# Patient Record
Sex: Male | Born: 1955 | Race: White | Hispanic: No
Health system: Southern US, Community
[De-identification: ages and names within clinical notes are randomized; demographics above are authoritative.]

## PROBLEM LIST (undated history)

## (undated) DIAGNOSIS — E039 Hypothyroidism, unspecified: Secondary | ICD-10-CM

## (undated) DIAGNOSIS — G8929 Other chronic pain: Secondary | ICD-10-CM

## (undated) DIAGNOSIS — G4730 Sleep apnea, unspecified: Secondary | ICD-10-CM

## (undated) DIAGNOSIS — Z8631 Personal history of diabetic foot ulcer: Secondary | ICD-10-CM

## (undated) DIAGNOSIS — E119 Type 2 diabetes mellitus without complications: Secondary | ICD-10-CM

## (undated) DIAGNOSIS — I4891 Unspecified atrial fibrillation: Secondary | ICD-10-CM

## (undated) DIAGNOSIS — L570 Actinic keratosis: Secondary | ICD-10-CM

## (undated) DIAGNOSIS — I10 Essential (primary) hypertension: Secondary | ICD-10-CM

## (undated) DIAGNOSIS — D696 Thrombocytopenia, unspecified: Secondary | ICD-10-CM

## (undated) DIAGNOSIS — N183 Chronic kidney disease, stage 3 unspecified: Secondary | ICD-10-CM

## (undated) DIAGNOSIS — E669 Obesity, unspecified: Secondary | ICD-10-CM

## (undated) DIAGNOSIS — E1142 Type 2 diabetes mellitus with diabetic polyneuropathy: Secondary | ICD-10-CM

## (undated) DIAGNOSIS — G629 Polyneuropathy, unspecified: Secondary | ICD-10-CM

## (undated) DIAGNOSIS — R269 Unspecified abnormalities of gait and mobility: Secondary | ICD-10-CM

## (undated) DIAGNOSIS — E785 Hyperlipidemia, unspecified: Secondary | ICD-10-CM

## (undated) DIAGNOSIS — M109 Gout, unspecified: Secondary | ICD-10-CM

## (undated) DIAGNOSIS — M545 Low back pain, unspecified: Secondary | ICD-10-CM

## (undated) DIAGNOSIS — R609 Edema, unspecified: Secondary | ICD-10-CM

## (undated) HISTORY — DX: Chronic kidney disease, stage 3 unspecified: N18.30

## (undated) HISTORY — DX: Essential (primary) hypertension: I10

## (undated) HISTORY — DX: Gout, unspecified: M10.9

## (undated) HISTORY — DX: Low back pain, unspecified: M54.50

## (undated) HISTORY — DX: Type 2 diabetes mellitus with diabetic polyneuropathy: E11.42

## (undated) HISTORY — DX: Hyperlipidemia, unspecified: E78.5

## (undated) HISTORY — PX: CHOLECYSTECTOMY: SHX55

## (undated) HISTORY — DX: Unspecified abnormalities of gait and mobility: R26.9

## (undated) HISTORY — DX: Hypothyroidism, unspecified: E03.9

## (undated) HISTORY — DX: Unspecified atrial fibrillation: I48.91

## (undated) HISTORY — DX: Actinic keratosis: L57.0

## (undated) HISTORY — DX: Obesity, unspecified: E66.9

## (undated) HISTORY — DX: Edema, unspecified: R60.9

## (undated) HISTORY — DX: Thrombocytopenia, unspecified: D69.6

## (undated) HISTORY — DX: Other chronic pain: G89.29

## (undated) HISTORY — DX: Sleep apnea, unspecified: G47.30

## (undated) HISTORY — DX: Polyneuropathy, unspecified: G62.9

## (undated) HISTORY — DX: Personal history of diabetic foot ulcer: Z86.31

## (undated) HISTORY — DX: Type 2 diabetes mellitus without complications: E11.9

---

## 1998-06-13 ENCOUNTER — Encounter: Payer: Self-pay | Admitting: Emergency Medicine

## 1998-06-13 ENCOUNTER — Emergency Department (HOSPITAL_COMMUNITY): Admission: EM | Admit: 1998-06-13 | Discharge: 1998-06-13 | Payer: Self-pay | Admitting: Emergency Medicine

## 1998-06-13 ENCOUNTER — Encounter: Admission: RE | Admit: 1998-06-13 | Discharge: 1998-06-13 | Payer: Self-pay | Admitting: *Deleted

## 1998-12-17 ENCOUNTER — Encounter: Payer: Self-pay | Admitting: Emergency Medicine

## 1998-12-17 ENCOUNTER — Emergency Department (HOSPITAL_COMMUNITY): Admission: EM | Admit: 1998-12-17 | Discharge: 1998-12-17 | Payer: Self-pay | Admitting: Emergency Medicine

## 1998-12-18 ENCOUNTER — Encounter: Admission: RE | Admit: 1998-12-18 | Discharge: 1998-12-18 | Payer: Self-pay | Admitting: *Deleted

## 2016-11-19 DIAGNOSIS — I10 Essential (primary) hypertension: Secondary | ICD-10-CM | POA: Diagnosis not present

## 2016-11-19 DIAGNOSIS — R109 Unspecified abdominal pain: Secondary | ICD-10-CM | POA: Diagnosis not present

## 2016-11-19 DIAGNOSIS — I480 Paroxysmal atrial fibrillation: Secondary | ICD-10-CM | POA: Diagnosis not present

## 2016-11-19 DIAGNOSIS — E119 Type 2 diabetes mellitus without complications: Secondary | ICD-10-CM | POA: Diagnosis not present

## 2016-11-19 DIAGNOSIS — G4733 Obstructive sleep apnea (adult) (pediatric): Secondary | ICD-10-CM | POA: Diagnosis not present

## 2016-11-19 DIAGNOSIS — K8020 Calculus of gallbladder without cholecystitis without obstruction: Secondary | ICD-10-CM

## 2016-11-20 DIAGNOSIS — E119 Type 2 diabetes mellitus without complications: Secondary | ICD-10-CM | POA: Diagnosis not present

## 2016-11-20 DIAGNOSIS — G4733 Obstructive sleep apnea (adult) (pediatric): Secondary | ICD-10-CM | POA: Diagnosis not present

## 2016-11-20 DIAGNOSIS — K8020 Calculus of gallbladder without cholecystitis without obstruction: Secondary | ICD-10-CM | POA: Diagnosis not present

## 2016-11-20 DIAGNOSIS — I480 Paroxysmal atrial fibrillation: Secondary | ICD-10-CM | POA: Diagnosis not present

## 2016-11-20 DIAGNOSIS — R109 Unspecified abdominal pain: Secondary | ICD-10-CM | POA: Diagnosis not present

## 2016-11-20 DIAGNOSIS — I10 Essential (primary) hypertension: Secondary | ICD-10-CM | POA: Diagnosis not present

## 2019-01-07 DIAGNOSIS — D6959 Other secondary thrombocytopenia: Secondary | ICD-10-CM | POA: Diagnosis not present

## 2019-01-07 DIAGNOSIS — D696 Thrombocytopenia, unspecified: Secondary | ICD-10-CM | POA: Diagnosis not present

## 2019-01-07 DIAGNOSIS — R161 Splenomegaly, not elsewhere classified: Secondary | ICD-10-CM | POA: Diagnosis not present

## 2019-01-11 ENCOUNTER — Encounter: Payer: Self-pay | Admitting: Neurology

## 2019-01-11 ENCOUNTER — Ambulatory Visit (INDEPENDENT_AMBULATORY_CARE_PROVIDER_SITE_OTHER): Payer: Medicare Other | Admitting: Neurology

## 2019-01-11 ENCOUNTER — Other Ambulatory Visit: Payer: Self-pay

## 2019-01-11 VITALS — BP 133/76 | HR 71 | Temp 98.4°F | Ht 69.0 in | Wt 293.0 lb

## 2019-01-11 DIAGNOSIS — E538 Deficiency of other specified B group vitamins: Secondary | ICD-10-CM

## 2019-01-11 DIAGNOSIS — E1142 Type 2 diabetes mellitus with diabetic polyneuropathy: Secondary | ICD-10-CM | POA: Diagnosis not present

## 2019-01-11 MED ORDER — DULOXETINE HCL 60 MG PO CPEP: 60.0000 mg | ORAL_CAPSULE | Freq: Two times a day (BID) | ORAL | 1 refills | Status: DC

## 2019-01-11 NOTE — Progress Notes (Addendum)
Reason for visit: Diabetic peripheral neuropathy  Referring physician: Dr. Aundria Mems is a 63 y.o. male  History of present illness:  Chris Wood is a 63 year old right-handed white male with a history of diabetes and obesity.  He claims that his last hemoglobin A1c was 5.6.  The patient believes that he has had diabetes for 5 or 6 years and over the last 3 to 4 years he has had some discomfort in the feet with numbness and achy pain.  The pain is worse in the evening hours when he tries to sleep.  He also has untreated sleep apnea, he is not using his CPAP machine, he wakes up 5-6 times a night and he does have some fatigue during the day.  He also suffers from low back pain.  He denies issues controlling the bowels or the bladder.  He has numbness that he perceives across the ankles bilaterally, he denies any numbness in the hands.  Over the last 6 months he has developed a resting tremor affecting the right upper extremity, he has not noticed any change in handwriting.  He denies any family history of tremor.  He has been treated with high-dose gabapentin taking 1200 mg 3 times daily and with Cymbalta taking 90 mg at night without complete benefit with his leg discomfort.  The patient has not noted any change in his mobility, he does have some balance issues, he fell about a year ago.  He does not use a cane when outside of the house.  He is sent to this office for evaluation of the neuropathy.  Past Medical History:  Diagnosis Date  . Diabetes ([place])   . Neuropathy     Past Surgical History:  Procedure Laterality Date  . CHOLECYSTECTOMY      Family History  Problem Relation Age of Onset  . Healthy Mother   . Cancer Father        esophageal     Social history:  reports that he quit smoking about 3 years ago. His smoking use included cigarettes. He has never used smokeless tobacco. He reports current alcohol use. He reports that he does not use drugs.  Medications:   Prior to Admission medications   Medication Sig Start Date End Date Taking? Authorizing Provider  allopurinol (ZYLOPRIM) 300 MG tablet Take 300 mg by mouth daily.   Yes [provider]  amiodarone (PACERONE) 200 MG tablet Take 200 mg by mouth daily. 1/2 tab daily   Yes [provider]  aspirin 81 MG tablet Take 81 mg by mouth daily.   Yes [provider]  Cholecalciferol (VITAMIN D3 PO) Take by mouth.   Yes [provider]  DULoxetine (CYMBALTA) 30 MG capsule Take 30 mg by mouth daily.   Yes [provider]  DULoxetine (CYMBALTA) 60 MG capsule Take 60 mg by mouth daily.   Yes [provider]  furosemide (LASIX) 40 MG tablet Take 40 mg by mouth.   Yes [provider]  gabapentin (NEURONTIN) 600 MG tablet Take 600 mg by mouth. 2 tablets three times a day   Yes [provider]  metFORMIN (GLUCOPHAGE-XR) 500 MG 24 hr tablet Take 500 mg by mouth 2 (two) times daily.   Yes [provider]  Multiple Vitamin (MULTIVITAMIN) capsule Take 1 capsule by mouth daily.   Yes [provider]  potassium chloride (K-DUR) 10 MEQ tablet Take 10 mEq by mouth daily.   Yes [provider]  rivaroxaban (XARELTO) 20 MG TABS tablet Take 20 mg by mouth daily with supper.   Yes [provider]  rosuvastatin (CRESTOR) 20 MG tablet Take 20 mg by mouth daily.   Yes [provider]  traMADol (ULTRAM) 50 MG tablet Take 50 mg by mouth every 6 (six) hours as needed.   Yes [provider]  valACYclovir (VALTREX) 500 MG tablet Take 500 mg by mouth daily.   Yes [provider]     Not on File  ROS:  Out of a complete 14 system review of symptoms, the patient complains only of the following symptoms, and all other reviewed systems are negative.  Right hand tremor Leg pain Chronic low back pain  Blood pressure 133/76, pulse 71, temperature 98.4 F (36.9 C), temperature source Oral, height 5'  9" (1.753 m), weight 293 lb (132.9 kg).  Physical Exam  General: The patient is alert and cooperative at the time of the examination.  The patient is markedly obese.  Eyes: Pupils are equal, round, and reactive to light. Discs are flat bilaterally.  Neck: The neck is supple, no carotid bruits are noted.  Respiratory: The respiratory examination is clear.  Cardiovascular: The cardiovascular examination reveals a regular rate and rhythm, no obvious murmurs or rubs are noted.  Skin: Extremities are with 3+ edema below the knees bilaterally.  Neurologic Exam  Mental status: The patient is alert and oriented x 3 at the time of the examination. The patient has apparent normal recent and remote memory, with an apparently normal attention span and concentration ability.  Cranial nerves: Facial symmetry is present. There is good sensation of the face to pinprick and soft touch bilaterally. The strength of the facial muscles and the muscles to head turning and shoulder shrug are normal bilaterally. Speech is well enunciated, no aphasia or dysarthria is noted. Extraocular movements are full, but with superior gaze there is exotropia of the left eye. Visual fields are full. The tongue is midline, and the patient has symmetric elevation of the soft palate. No obvious hearing deficits are noted.  Motor: The motor testing reveals 5 over 5 strength of all 4 extremities. Good symmetric motor tone is noted throughout.  Sensory: Sensory testing is intact to pinprick, soft touch, vibration sensation, and position sense on the upper extremities.  With the lower extremities, there is a stocking pattern pinprick sensory deficit in the distal two thirds of the legs bilaterally below the knees.  Vibration sensation and position sensation are impaired in both feet.  No evidence of extinction is noted.  Coordination: Cerebellar testing reveals good finger-nose-finger and heel-to-shin bilaterally.  A resting tremor  seen with the right upper extremity.  Gait and station: Gait is slightly wide-based, the patient can walk independently.  He does have a arm swing bilaterally.  Tandem gait is unsteady.  Romberg is negative. No drift is seen.  Reflexes: Deep tendon reflexes are symmetric, but are depressed bilaterally. Toes are downgoing bilaterally.   Assessment/Plan:  1.  Diabetes  2.  Probable diabetic peripheral neuropathy  3.  Resting tremor, right upper extremity  The patient likely has a diabetic neuropathy.  He will be sent for blood work today.  At some point in the future, we may consider EMG nerve conduction study evaluation as well.  The patient will be increased on the Cymbalta taking 60 mg twice daily, he will call me in 2 weeks if he is not getting significant benefit, we may add low-dose Trileptal or  Keppra to the regimen.  The patient is on Xarelto which may interact with some anticonvulsant medications.  He will follow-up in about 4 months.  We will need to follow him along for any developing signs of parkinsonism.  Jill Alexanders MD 01/11/2019 3:35 PM  [facility] [address] [place], [place] [address]  Phone [phone] Fax [phone]

## 2019-01-11 NOTE — Patient Instructions (Signed)
We will go up on the cymbalta to 60 mg twice a day, if no better in 2 weeks, call for medication adjustments.  Cymbalta (duloxetine) is an antidepressant medication that is commonly used for peripheral neuropathy pain or for fibromyalgia pain. As with any antidepressant medication, worsening depression can be seen. This medication can potentially cause headache, dizziness, sexual dysfunction, or nausea. If any problems are noted on this medication, please contact our office.

## 2019-01-13 ENCOUNTER — Telehealth: Payer: Self-pay

## 2019-01-13 LAB — MULTIPLE MYELOMA PANEL, SERUM
Albumin SerPl Elph-Mcnc: 4.4 g/dL (ref 2.9–4.4)
Albumin/Glob SerPl: 1.7 (ref 0.7–1.7)
Alpha 1: 0.3 g/dL (ref 0.0–0.4)
Alpha2 Glob SerPl Elph-Mcnc: 0.6 g/dL (ref 0.4–1.0)
B-Globulin SerPl Elph-Mcnc: 1 g/dL (ref 0.7–1.3)
Gamma Glob SerPl Elph-Mcnc: 0.8 g/dL (ref 0.4–1.8)
Globulin, Total: 2.7 g/dL (ref 2.2–3.9)
IgA/Immunoglobulin A, Serum: 243 mg/dL (ref 61–437)
IgG (Immunoglobin G), Serum: 764 mg/dL (ref 603–1613)
IgM (Immunoglobulin M), Srm: 109 mg/dL (ref 20–172)
Total Protein: 7.1 g/dL (ref 6.0–8.5)

## 2019-01-13 LAB — ANA W/REFLEX: Anti Nuclear Antibody (ANA): NEGATIVE

## 2019-01-13 LAB — SEDIMENTATION RATE: Sed Rate: 5 mm/hr (ref 0–30)

## 2019-01-13 LAB — ANGIOTENSIN CONVERTING ENZYME: Angio Convert Enzyme: 33 U/L (ref 14–82)

## 2019-01-13 LAB — VITAMIN B12: Vitamin B-12: 498 pg/mL (ref 232–1245)

## 2019-01-13 LAB — B. BURGDORFI ANTIBODIES: Lyme IgG/IgM Ab: <0.91 {ISR} (ref 0.00–0.90)

## 2019-01-13 NOTE — Telephone Encounter (Signed)
-----   Message from Kathrynn Ducking, MD sent at 01/13/2019  7:12 AM EDT -----  The blood work results are unremarkable. Please call the patient. ----- Message ----- From: Lavone Neri Lab Results In Sent: 01/12/2019   7:37 AM EDT To: Kathrynn Ducking, MD

## 2019-01-13 NOTE — Telephone Encounter (Signed)
I contacted the pt and was able to review blood test results. Pt verbalized understanding.

## 2019-01-27 DIAGNOSIS — E1142 Type 2 diabetes mellitus with diabetic polyneuropathy: Secondary | ICD-10-CM | POA: Diagnosis not present

## 2019-01-27 DIAGNOSIS — Z8631 Personal history of diabetic foot ulcer: Secondary | ICD-10-CM | POA: Diagnosis not present

## 2019-01-27 DIAGNOSIS — L84 Corns and callosities: Secondary | ICD-10-CM | POA: Diagnosis not present

## 2019-02-10 ENCOUNTER — Telehealth: Payer: Self-pay | Admitting: Neurology

## 2019-02-10 MED ORDER — LEVETIRACETAM 250 MG PO TABS: 250.0000 mg | ORAL_TABLET | Freq: Two times a day (BID) | ORAL | 2 refills | Status: DC

## 2019-02-10 NOTE — Telephone Encounter (Signed)
I called the patient.  The increased dose for Cymbalta has not helped his neuropathy pain.  As we had discussed in the office visit recently, we will add another medication called Keppra, we will start at 250 mg twice daily.  He will call me if this is not helpful.

## 2019-02-10 NOTE — Telephone Encounter (Signed)
Pt called stating that the DULoxetine (CYMBALTA) 60 MG capsule is not enough for him. Please advise.

## 2019-03-02 ENCOUNTER — Other Ambulatory Visit: Payer: Self-pay

## 2019-03-02 MED ORDER — LEVETIRACETAM 250 MG PO TABS: 250.0000 mg | ORAL_TABLET | Freq: Two times a day (BID) | ORAL | 2 refills | Status: DC

## 2019-03-04 DIAGNOSIS — N029 Recurrent and persistent hematuria with unspecified morphologic changes: Secondary | ICD-10-CM | POA: Diagnosis not present

## 2019-03-04 DIAGNOSIS — R82998 Other abnormal findings in urine: Secondary | ICD-10-CM | POA: Diagnosis not present

## 2019-03-11 DIAGNOSIS — N029 Recurrent and persistent hematuria with unspecified morphologic changes: Secondary | ICD-10-CM | POA: Diagnosis not present

## 2019-03-29 DIAGNOSIS — I480 Paroxysmal atrial fibrillation: Secondary | ICD-10-CM | POA: Diagnosis not present

## 2019-03-29 DIAGNOSIS — E039 Hypothyroidism, unspecified: Secondary | ICD-10-CM | POA: Diagnosis not present

## 2019-03-29 DIAGNOSIS — E02 Subclinical iodine-deficiency hypothyroidism: Secondary | ICD-10-CM | POA: Diagnosis not present

## 2019-03-29 DIAGNOSIS — I10 Essential (primary) hypertension: Secondary | ICD-10-CM | POA: Diagnosis not present

## 2019-03-29 DIAGNOSIS — E782 Mixed hyperlipidemia: Secondary | ICD-10-CM | POA: Diagnosis not present

## 2019-04-02 DIAGNOSIS — E02 Subclinical iodine-deficiency hypothyroidism: Secondary | ICD-10-CM | POA: Diagnosis not present

## 2019-04-14 DIAGNOSIS — L97529 Non-pressure chronic ulcer of other part of left foot with unspecified severity: Secondary | ICD-10-CM | POA: Diagnosis not present

## 2019-04-14 DIAGNOSIS — I872 Venous insufficiency (chronic) (peripheral): Secondary | ICD-10-CM | POA: Diagnosis not present

## 2019-04-14 DIAGNOSIS — E11621 Type 2 diabetes mellitus with foot ulcer: Secondary | ICD-10-CM | POA: Diagnosis not present

## 2019-04-14 DIAGNOSIS — E1142 Type 2 diabetes mellitus with diabetic polyneuropathy: Secondary | ICD-10-CM | POA: Diagnosis not present

## 2019-04-14 DIAGNOSIS — I890 Lymphedema, not elsewhere classified: Secondary | ICD-10-CM | POA: Diagnosis not present

## 2019-05-17 DIAGNOSIS — L84 Corns and callosities: Secondary | ICD-10-CM | POA: Diagnosis not present

## 2019-05-17 DIAGNOSIS — Z8631 Personal history of diabetic foot ulcer: Secondary | ICD-10-CM | POA: Diagnosis not present

## 2019-05-17 DIAGNOSIS — E1142 Type 2 diabetes mellitus with diabetic polyneuropathy: Secondary | ICD-10-CM | POA: Diagnosis not present

## 2019-05-25 ENCOUNTER — Encounter: Payer: Self-pay | Admitting: Neurology

## 2019-05-25 ENCOUNTER — Other Ambulatory Visit: Payer: Self-pay

## 2019-05-25 ENCOUNTER — Ambulatory Visit (INDEPENDENT_AMBULATORY_CARE_PROVIDER_SITE_OTHER): Payer: Medicare Other | Admitting: Neurology

## 2019-05-25 DIAGNOSIS — E1140 Type 2 diabetes mellitus with diabetic neuropathy, unspecified: Secondary | ICD-10-CM | POA: Insufficient documentation

## 2019-05-25 DIAGNOSIS — E1142 Type 2 diabetes mellitus with diabetic polyneuropathy: Secondary | ICD-10-CM | POA: Diagnosis not present

## 2019-05-25 NOTE — Progress Notes (Signed)
Reason for visit: Diabetic peripheral neuropathy  Chris Wood is an 63 y.o. male  History of present illness:  Chris Wood is a 63 year old right-handed white male with a history of obesity, sleep apnea, diabetes, and diabetic peripheral neuropathy.  The patient is not on CPAP, he will wake up frequently at night.  He sometimes will have trouble getting to sleep, he may take Advil PM but this causes bad dreams.  The patient had a tremor on the right upper extremity when last seen, this has not been a problem since that time.  He is on full dose Cymbalta, Keppra was added to the regimen with good improvement.  He is not having a lot of discomfort from the neuropathy at this point.  He denies any significant balance issues or falls.  Past Medical History:  Diagnosis Date  . Actinic keratosis   . Atrial fibrillation ([place])   . Chronic low back pain   . Chronic renal insufficiency, stage 3 (moderate) (HCC)   . Diabetes ([place])   . Diabetes ([place])   . Diabetic peripheral neuropathy ([place])   . Gait disorder   . Gout   . Hyperlipidemia   . Hypertension   . Hypothyroidism   . Neuropathy   . Obesity   . Peripheral edema   . Personal history of diabetic foot ulcer   . Sleep apnea   . Thrombocytopenia ([place])     Past Surgical History:  Procedure Laterality Date  . CHOLECYSTECTOMY      Family History  Problem Relation Age of Onset  . Healthy Mother   . Cancer Father        esophageal   . Cancer Sister   . Stroke Sister     Social history:  reports that he quit smoking about 3 years ago. His smoking use included cigarettes. He has never used smokeless tobacco. He reports current alcohol use. He reports that he does not use drugs.   Not on File  Medications:  Prior to Admission medications   Medication Sig Start Date End Date Taking? Authorizing Provider  allopurinol (ZYLOPRIM) 300 MG tablet Take 300 mg by mouth daily.   Yes [provider]  amiodarone (PACERONE)  200 MG tablet Take 200 mg by mouth daily. 1/2 tab daily   Yes [provider]  aspirin 81 MG tablet Take 81 mg by mouth daily.   Yes [provider]  Cholecalciferol (VITAMIN D3 PO) Take by mouth.   Yes [provider]  DULoxetine (CYMBALTA) 60 MG capsule Take 1 capsule (60 mg total) by mouth 2 (two) times daily. 01/11/19  Yes Kathrynn Ducking, MD  furosemide (LASIX) 40 MG tablet Take 40 mg by mouth.   Yes [provider]  gabapentin (NEURONTIN) 600 MG tablet Take 600 mg by mouth. 2 tablets three times a day   Yes [provider]  levETIRAcetam (KEPPRA) 250 MG tablet Take 1 tablet (250 mg total) by mouth 2 (two) times daily. 03/02/19  Yes Kathrynn Ducking, MD  metFORMIN (GLUCOPHAGE-XR) 500 MG 24 hr tablet Take 500 mg by mouth 2 (two) times daily.   Yes [provider]  Multiple Vitamin (MULTIVITAMIN) capsule Take 1 capsule by mouth daily.   Yes [provider]  potassium chloride (K-DUR) 10 MEQ tablet Take 10 mEq by mouth daily.   Yes [provider]  rivaroxaban (XARELTO) 20 MG TABS tablet Take 20 mg by mouth daily with supper.   Yes [provider]  rosuvastatin (CRESTOR) 20 MG tablet Take 20 mg by mouth daily.   Yes [provider]  traMADol (ULTRAM) 50 MG tablet Take 50 mg by mouth every 6 (six) hours as needed.   Yes [provider]  valACYclovir (VALTREX) 500 MG tablet Take 500 mg by mouth daily.   Yes [provider]    ROS:  Out of a complete 14 system review of symptoms, the patient complains only of the following symptoms, and all other reviewed systems are negative.  Numbness Insomnia, frequent waking  Blood pressure 136/84, pulse 92, temperature (!) 96.2 F (35.7 C), temperature source Temporal, height 5\' 9"  (1.753 m), weight 292 lb 5 oz (132.6 kg).  Physical Exam  General: The patient is alert and cooperative at the time of the examination.  The patient is markedly  obese.  Skin: 3+ edema below the knees is seen bilaterally.   Neurologic Exam  Mental status: The patient is alert and oriented x 3 at the time of the examination. The patient has apparent normal recent and remote memory, with an apparently normal attention span and concentration ability.   Cranial nerves: Facial symmetry is present. Speech is normal, no aphasia or dysarthria is noted. Extraocular movements are full. Visual fields are full.  Motor: The patient has good strength in all 4 extremities.  Sensory examination: Soft touch sensation is symmetric on the face, arms, and legs.  Coordination: The patient has good finger-nose-finger and heel-to-shin bilaterally.  Gait and station: The patient has a normal gait good arm swing is seen bilaterally. Tandem gait is minimally unsteady. Romberg is negative. No drift is seen.  Reflexes: Deep tendon reflexes are symmetric.   Assessment/Plan:  1.  Diabetic peripheral neuropathy  2.  Sleep apnea, not on CPAP  I have indicated to the patient if he wishes to make contact with a sleep physician, I will make a referral, he has no one following him for his sleep apnea currently.  The patient is doing better with his peripheral neuropathy pain.  He will continue on the Cymbalta, gabapentin, and Keppra.  He will follow-up here in 6 months.  The tremor seen on the right upper extremity on last visit is no longer present.  Jill Alexanders MD 05/25/2019 2:52 PM  [facility] [address] [place], [place] [address]  Phone [phone] Fax [phone]

## 2019-06-23 ENCOUNTER — Other Ambulatory Visit: Payer: Self-pay | Admitting: Neurology

## 2019-07-01 DIAGNOSIS — R319 Hematuria, unspecified: Secondary | ICD-10-CM | POA: Diagnosis not present

## 2019-07-10 DIAGNOSIS — E039 Hypothyroidism, unspecified: Secondary | ICD-10-CM | POA: Diagnosis not present

## 2019-07-10 DIAGNOSIS — E782 Mixed hyperlipidemia: Secondary | ICD-10-CM | POA: Diagnosis not present

## 2019-07-10 DIAGNOSIS — E1142 Type 2 diabetes mellitus with diabetic polyneuropathy: Secondary | ICD-10-CM | POA: Diagnosis not present

## 2019-07-10 DIAGNOSIS — N1831 Chronic kidney disease, stage 3a: Secondary | ICD-10-CM | POA: Diagnosis not present

## 2019-07-10 DIAGNOSIS — M109 Gout, unspecified: Secondary | ICD-10-CM | POA: Diagnosis not present

## 2019-07-13 DIAGNOSIS — L84 Corns and callosities: Secondary | ICD-10-CM | POA: Diagnosis not present

## 2019-07-13 DIAGNOSIS — Z6841 Body Mass Index (BMI) 40.0 and over, adult: Secondary | ICD-10-CM | POA: Diagnosis not present

## 2019-07-13 DIAGNOSIS — E785 Hyperlipidemia, unspecified: Secondary | ICD-10-CM | POA: Diagnosis not present

## 2019-07-13 DIAGNOSIS — E1142 Type 2 diabetes mellitus with diabetic polyneuropathy: Secondary | ICD-10-CM | POA: Diagnosis not present

## 2019-07-13 DIAGNOSIS — N529 Male erectile dysfunction, unspecified: Secondary | ICD-10-CM | POA: Diagnosis not present

## 2019-07-13 DIAGNOSIS — N1831 Chronic kidney disease, stage 3a: Secondary | ICD-10-CM | POA: Diagnosis not present

## 2019-07-13 DIAGNOSIS — G4733 Obstructive sleep apnea (adult) (pediatric): Secondary | ICD-10-CM | POA: Diagnosis not present

## 2019-07-13 DIAGNOSIS — M1009 Idiopathic gout, multiple sites: Secondary | ICD-10-CM | POA: Diagnosis not present

## 2019-07-13 DIAGNOSIS — Z23 Encounter for immunization: Secondary | ICD-10-CM | POA: Diagnosis not present

## 2019-07-13 DIAGNOSIS — Z0000 Encounter for general adult medical examination without abnormal findings: Secondary | ICD-10-CM | POA: Diagnosis not present

## 2019-07-13 DIAGNOSIS — E039 Hypothyroidism, unspecified: Secondary | ICD-10-CM | POA: Diagnosis not present

## 2019-07-13 DIAGNOSIS — I872 Venous insufficiency (chronic) (peripheral): Secondary | ICD-10-CM | POA: Diagnosis not present

## 2019-07-13 DIAGNOSIS — E1122 Type 2 diabetes mellitus with diabetic chronic kidney disease: Secondary | ICD-10-CM | POA: Diagnosis not present

## 2019-07-14 DIAGNOSIS — D696 Thrombocytopenia, unspecified: Secondary | ICD-10-CM | POA: Diagnosis not present

## 2019-07-14 DIAGNOSIS — R161 Splenomegaly, not elsewhere classified: Secondary | ICD-10-CM | POA: Diagnosis not present

## 2019-07-14 DIAGNOSIS — R319 Hematuria, unspecified: Secondary | ICD-10-CM | POA: Diagnosis not present

## 2019-07-14 DIAGNOSIS — E1142 Type 2 diabetes mellitus with diabetic polyneuropathy: Secondary | ICD-10-CM | POA: Diagnosis not present

## 2019-07-14 DIAGNOSIS — Z7982 Long term (current) use of aspirin: Secondary | ICD-10-CM | POA: Diagnosis not present

## 2019-07-14 DIAGNOSIS — D6959 Other secondary thrombocytopenia: Secondary | ICD-10-CM | POA: Diagnosis not present

## 2019-07-20 DIAGNOSIS — R310 Gross hematuria: Secondary | ICD-10-CM | POA: Diagnosis not present

## 2019-07-20 DIAGNOSIS — N200 Calculus of kidney: Secondary | ICD-10-CM | POA: Diagnosis not present

## 2019-07-20 DIAGNOSIS — R319 Hematuria, unspecified: Secondary | ICD-10-CM | POA: Diagnosis not present

## 2019-07-27 DIAGNOSIS — R310 Gross hematuria: Secondary | ICD-10-CM | POA: Diagnosis not present

## 2019-07-28 DIAGNOSIS — R310 Gross hematuria: Secondary | ICD-10-CM | POA: Diagnosis not present

## 2019-08-12 DIAGNOSIS — G4733 Obstructive sleep apnea (adult) (pediatric): Secondary | ICD-10-CM | POA: Diagnosis not present

## 2019-09-06 DIAGNOSIS — Z20822 Contact with and (suspected) exposure to covid-19: Secondary | ICD-10-CM | POA: Diagnosis not present

## 2019-09-06 DIAGNOSIS — G4733 Obstructive sleep apnea (adult) (pediatric): Secondary | ICD-10-CM | POA: Diagnosis not present

## 2019-09-06 DIAGNOSIS — Z01812 Encounter for preprocedural laboratory examination: Secondary | ICD-10-CM | POA: Diagnosis not present

## 2019-09-22 ENCOUNTER — Other Ambulatory Visit: Payer: Self-pay

## 2019-09-22 ENCOUNTER — Other Ambulatory Visit: Payer: Self-pay | Admitting: Neurology

## 2019-09-22 MED ORDER — GABAPENTIN 600 MG PO TABS: ORAL_TABLET | ORAL | 3 refills | Status: DC

## 2019-09-22 MED ORDER — GABAPENTIN 600 MG PO TABS: ORAL_TABLET | ORAL | 5 refills | Status: DC

## 2019-09-22 NOTE — Telephone Encounter (Signed)
Refill has been submitted the to the pharmacy for the pt.

## 2019-09-22 NOTE — Telephone Encounter (Signed)
1) Medication(s) Requested (by name):  gabapentin (NEURONTIN) 600 MG tablet   2) [facility]

## 2019-10-04 DIAGNOSIS — Z20822 Contact with and (suspected) exposure to covid-19: Secondary | ICD-10-CM | POA: Diagnosis not present

## 2019-10-04 DIAGNOSIS — G4733 Obstructive sleep apnea (adult) (pediatric): Secondary | ICD-10-CM | POA: Diagnosis not present

## 2019-10-04 DIAGNOSIS — Z01812 Encounter for preprocedural laboratory examination: Secondary | ICD-10-CM | POA: Diagnosis not present

## 2019-10-08 DIAGNOSIS — G4733 Obstructive sleep apnea (adult) (pediatric): Secondary | ICD-10-CM | POA: Diagnosis not present

## 2019-10-13 DIAGNOSIS — G4733 Obstructive sleep apnea (adult) (pediatric): Secondary | ICD-10-CM | POA: Diagnosis not present

## 2019-10-18 DIAGNOSIS — G4733 Obstructive sleep apnea (adult) (pediatric): Secondary | ICD-10-CM | POA: Diagnosis not present

## 2019-10-19 DIAGNOSIS — N1831 Chronic kidney disease, stage 3a: Secondary | ICD-10-CM | POA: Diagnosis not present

## 2019-10-19 DIAGNOSIS — N400 Enlarged prostate without lower urinary tract symptoms: Secondary | ICD-10-CM | POA: Diagnosis not present

## 2019-10-19 DIAGNOSIS — N200 Calculus of kidney: Secondary | ICD-10-CM | POA: Diagnosis not present

## 2019-10-19 DIAGNOSIS — R310 Gross hematuria: Secondary | ICD-10-CM | POA: Diagnosis not present

## 2019-11-01 DIAGNOSIS — Z8631 Personal history of diabetic foot ulcer: Secondary | ICD-10-CM | POA: Diagnosis not present

## 2019-11-01 DIAGNOSIS — E119 Type 2 diabetes mellitus without complications: Secondary | ICD-10-CM | POA: Diagnosis not present

## 2019-11-01 DIAGNOSIS — E1142 Type 2 diabetes mellitus with diabetic polyneuropathy: Secondary | ICD-10-CM | POA: Diagnosis not present

## 2019-11-01 DIAGNOSIS — L84 Corns and callosities: Secondary | ICD-10-CM | POA: Diagnosis not present

## 2019-11-18 DIAGNOSIS — G4733 Obstructive sleep apnea (adult) (pediatric): Secondary | ICD-10-CM | POA: Diagnosis not present

## 2019-11-18 DIAGNOSIS — E1142 Type 2 diabetes mellitus with diabetic polyneuropathy: Secondary | ICD-10-CM | POA: Diagnosis not present

## 2019-11-18 DIAGNOSIS — K353 Acute appendicitis with localized peritonitis, without perforation or gangrene: Secondary | ICD-10-CM | POA: Diagnosis not present

## 2019-11-18 DIAGNOSIS — N1831 Chronic kidney disease, stage 3a: Secondary | ICD-10-CM | POA: Diagnosis not present

## 2019-11-18 DIAGNOSIS — E1122 Type 2 diabetes mellitus with diabetic chronic kidney disease: Secondary | ICD-10-CM | POA: Diagnosis not present

## 2019-11-18 DIAGNOSIS — I129 Hypertensive chronic kidney disease with stage 1 through stage 4 chronic kidney disease, or unspecified chronic kidney disease: Secondary | ICD-10-CM | POA: Diagnosis not present

## 2019-11-18 DIAGNOSIS — Z6841 Body Mass Index (BMI) 40.0 and over, adult: Secondary | ICD-10-CM | POA: Diagnosis not present

## 2019-11-18 DIAGNOSIS — I480 Paroxysmal atrial fibrillation: Secondary | ICD-10-CM | POA: Diagnosis not present

## 2019-11-18 DIAGNOSIS — Z87891 Personal history of nicotine dependence: Secondary | ICD-10-CM | POA: Diagnosis not present

## 2019-11-18 DIAGNOSIS — R1031 Right lower quadrant pain: Secondary | ICD-10-CM | POA: Diagnosis not present

## 2019-11-18 DIAGNOSIS — N183 Chronic kidney disease, stage 3 unspecified: Secondary | ICD-10-CM | POA: Diagnosis not present

## 2019-11-18 DIAGNOSIS — Z20822 Contact with and (suspected) exposure to covid-19: Secondary | ICD-10-CM | POA: Diagnosis not present

## 2019-11-19 DIAGNOSIS — K353 Acute appendicitis with localized peritonitis, without perforation or gangrene: Secondary | ICD-10-CM | POA: Diagnosis not present

## 2019-11-19 DIAGNOSIS — K3580 Unspecified acute appendicitis: Secondary | ICD-10-CM | POA: Diagnosis not present

## 2019-11-19 DIAGNOSIS — K660 Peritoneal adhesions (postprocedural) (postinfection): Secondary | ICD-10-CM | POA: Diagnosis not present

## 2019-11-20 DIAGNOSIS — R569 Unspecified convulsions: Secondary | ICD-10-CM | POA: Diagnosis present

## 2019-11-20 DIAGNOSIS — Z87891 Personal history of nicotine dependence: Secondary | ICD-10-CM | POA: Diagnosis not present

## 2019-11-20 DIAGNOSIS — K3580 Unspecified acute appendicitis: Secondary | ICD-10-CM | POA: Diagnosis not present

## 2019-11-20 DIAGNOSIS — N1831 Chronic kidney disease, stage 3a: Secondary | ICD-10-CM | POA: Diagnosis present

## 2019-11-20 DIAGNOSIS — N400 Enlarged prostate without lower urinary tract symptoms: Secondary | ICD-10-CM | POA: Diagnosis not present

## 2019-11-20 DIAGNOSIS — E1122 Type 2 diabetes mellitus with diabetic chronic kidney disease: Secondary | ICD-10-CM | POA: Diagnosis present

## 2019-11-20 DIAGNOSIS — D696 Thrombocytopenia, unspecified: Secondary | ICD-10-CM | POA: Diagnosis present

## 2019-11-20 DIAGNOSIS — E1142 Type 2 diabetes mellitus with diabetic polyneuropathy: Secondary | ICD-10-CM | POA: Diagnosis present

## 2019-11-20 DIAGNOSIS — I129 Hypertensive chronic kidney disease with stage 1 through stage 4 chronic kidney disease, or unspecified chronic kidney disease: Secondary | ICD-10-CM | POA: Diagnosis present

## 2019-11-20 DIAGNOSIS — I480 Paroxysmal atrial fibrillation: Secondary | ICD-10-CM | POA: Diagnosis present

## 2019-11-20 DIAGNOSIS — Z7982 Long term (current) use of aspirin: Secondary | ICD-10-CM | POA: Diagnosis not present

## 2019-11-20 DIAGNOSIS — E1140 Type 2 diabetes mellitus with diabetic neuropathy, unspecified: Secondary | ICD-10-CM | POA: Diagnosis not present

## 2019-11-20 DIAGNOSIS — Z20822 Contact with and (suspected) exposure to covid-19: Secondary | ICD-10-CM | POA: Diagnosis present

## 2019-11-20 DIAGNOSIS — I452 Bifascicular block: Secondary | ICD-10-CM | POA: Diagnosis not present

## 2019-11-20 DIAGNOSIS — M109 Gout, unspecified: Secondary | ICD-10-CM | POA: Diagnosis present

## 2019-11-20 DIAGNOSIS — G4733 Obstructive sleep apnea (adult) (pediatric): Secondary | ICD-10-CM | POA: Diagnosis present

## 2019-11-20 DIAGNOSIS — Z6841 Body Mass Index (BMI) 40.0 and over, adult: Secondary | ICD-10-CM | POA: Diagnosis not present

## 2019-11-20 DIAGNOSIS — K353 Acute appendicitis with localized peritonitis, without perforation or gangrene: Secondary | ICD-10-CM | POA: Diagnosis present

## 2019-11-20 DIAGNOSIS — Z7901 Long term (current) use of anticoagulants: Secondary | ICD-10-CM | POA: Diagnosis not present

## 2019-11-20 DIAGNOSIS — Z7984 Long term (current) use of oral hypoglycemic drugs: Secondary | ICD-10-CM | POA: Diagnosis not present

## 2019-11-24 ENCOUNTER — Ambulatory Visit: Payer: Medicare Other | Admitting: Neurology

## 2019-11-29 DIAGNOSIS — L814 Other melanin hyperpigmentation: Secondary | ICD-10-CM | POA: Diagnosis not present

## 2019-11-29 DIAGNOSIS — X32XXXS Exposure to sunlight, sequela: Secondary | ICD-10-CM | POA: Diagnosis not present

## 2019-11-29 DIAGNOSIS — L821 Other seborrheic keratosis: Secondary | ICD-10-CM | POA: Diagnosis not present

## 2019-11-29 DIAGNOSIS — L218 Other seborrheic dermatitis: Secondary | ICD-10-CM | POA: Diagnosis not present

## 2019-11-29 DIAGNOSIS — D1801 Hemangioma of skin and subcutaneous tissue: Secondary | ICD-10-CM | POA: Diagnosis not present

## 2019-11-30 ENCOUNTER — Other Ambulatory Visit: Payer: Self-pay | Admitting: Neurology

## 2019-11-30 DIAGNOSIS — Z23 Encounter for immunization: Secondary | ICD-10-CM | POA: Diagnosis not present

## 2019-12-01 ENCOUNTER — Other Ambulatory Visit: Payer: Self-pay

## 2019-12-01 MED ORDER — LEVETIRACETAM 250 MG PO TABS: 250.0000 mg | ORAL_TABLET | Freq: Two times a day (BID) | ORAL | 1 refills | Status: DC

## 2019-12-21 DIAGNOSIS — Z23 Encounter for immunization: Secondary | ICD-10-CM | POA: Diagnosis not present

## 2019-12-31 DIAGNOSIS — K760 Fatty (change of) liver, not elsewhere classified: Secondary | ICD-10-CM | POA: Diagnosis not present

## 2019-12-31 DIAGNOSIS — N200 Calculus of kidney: Secondary | ICD-10-CM | POA: Diagnosis not present

## 2019-12-31 DIAGNOSIS — K838 Other specified diseases of biliary tract: Secondary | ICD-10-CM | POA: Diagnosis not present

## 2019-12-31 DIAGNOSIS — Z9049 Acquired absence of other specified parts of digestive tract: Secondary | ICD-10-CM | POA: Diagnosis not present

## 2020-01-12 DIAGNOSIS — D696 Thrombocytopenia, unspecified: Secondary | ICD-10-CM | POA: Diagnosis not present

## 2020-01-12 DIAGNOSIS — R161 Splenomegaly, not elsewhere classified: Secondary | ICD-10-CM | POA: Diagnosis not present

## 2020-01-12 DIAGNOSIS — I10 Essential (primary) hypertension: Secondary | ICD-10-CM | POA: Diagnosis not present

## 2020-01-15 DIAGNOSIS — E1142 Type 2 diabetes mellitus with diabetic polyneuropathy: Secondary | ICD-10-CM | POA: Diagnosis not present

## 2020-01-15 DIAGNOSIS — E782 Mixed hyperlipidemia: Secondary | ICD-10-CM | POA: Diagnosis not present

## 2020-01-15 DIAGNOSIS — M109 Gout, unspecified: Secondary | ICD-10-CM | POA: Diagnosis not present

## 2020-01-15 DIAGNOSIS — N1831 Chronic kidney disease, stage 3a: Secondary | ICD-10-CM | POA: Diagnosis not present

## 2020-01-17 DIAGNOSIS — H6123 Impacted cerumen, bilateral: Secondary | ICD-10-CM | POA: Diagnosis not present

## 2020-01-20 ENCOUNTER — Encounter: Payer: Self-pay | Admitting: Neurology

## 2020-01-20 ENCOUNTER — Other Ambulatory Visit: Payer: Self-pay

## 2020-01-20 ENCOUNTER — Ambulatory Visit (INDEPENDENT_AMBULATORY_CARE_PROVIDER_SITE_OTHER): Payer: Medicare Other | Admitting: Neurology

## 2020-01-20 VITALS — BP 140/90 | HR 105 | Ht 69.0 in | Wt 311.0 lb

## 2020-01-20 DIAGNOSIS — E1142 Type 2 diabetes mellitus with diabetic polyneuropathy: Secondary | ICD-10-CM | POA: Diagnosis not present

## 2020-01-20 DIAGNOSIS — I129 Hypertensive chronic kidney disease with stage 1 through stage 4 chronic kidney disease, or unspecified chronic kidney disease: Secondary | ICD-10-CM | POA: Diagnosis not present

## 2020-01-20 DIAGNOSIS — E785 Hyperlipidemia, unspecified: Secondary | ICD-10-CM | POA: Diagnosis not present

## 2020-01-20 DIAGNOSIS — I872 Venous insufficiency (chronic) (peripheral): Secondary | ICD-10-CM | POA: Diagnosis not present

## 2020-01-20 DIAGNOSIS — H25013 Cortical age-related cataract, bilateral: Secondary | ICD-10-CM | POA: Diagnosis not present

## 2020-01-20 DIAGNOSIS — H04123 Dry eye syndrome of bilateral lacrimal glands: Secondary | ICD-10-CM | POA: Diagnosis not present

## 2020-01-20 DIAGNOSIS — N1831 Chronic kidney disease, stage 3a: Secondary | ICD-10-CM | POA: Diagnosis not present

## 2020-01-20 DIAGNOSIS — M109 Gout, unspecified: Secondary | ICD-10-CM | POA: Diagnosis not present

## 2020-01-20 DIAGNOSIS — I890 Lymphedema, not elsewhere classified: Secondary | ICD-10-CM | POA: Diagnosis not present

## 2020-01-20 DIAGNOSIS — E119 Type 2 diabetes mellitus without complications: Secondary | ICD-10-CM | POA: Diagnosis not present

## 2020-01-20 MED ORDER — DULOXETINE HCL 60 MG PO CPEP: 60.0000 mg | ORAL_CAPSULE | Freq: Two times a day (BID) | ORAL | 3 refills | Status: DC

## 2020-01-20 MED ORDER — LEVETIRACETAM 250 MG PO TABS: ORAL_TABLET | ORAL | 1 refills | Status: DC

## 2020-01-20 NOTE — Patient Instructions (Signed)
Increase Keppra 250 mg in the morning, 500 mg at bedtime  Continue Cymbalta, Gabapentin Please see your primary doctor See you back in 6 months

## 2020-01-20 NOTE — Progress Notes (Signed)
I have read the note, and I agree with the clinical assessment and plan.  Charles K Willis   

## 2020-01-20 NOTE — Progress Notes (Signed)
PATIENT: Chris Wood DOB: 04/25/1956  REASON FOR VISIT: follow up HISTORY FROM: patient  HISTORY OF PRESENT ILLNESS: Today 01/20/20  Chris Wood 64 year old male with history of obesity, sleep apnea, diabetes, and peripheral neuropathy.  He is now on CPAP.  He is on full dose  Cymbalta and Keppra for neuropathy, has reported increased pain at night.  He lives with his wife.  He saw his eye doctor earlier today, his eyes were dilated.  He has a tremor that comes and goes, can be in both hands.  Denies any recent falls.  Since last seen, has gained about 20 pounds.  He is tolerating medications.  He presents today for evaluation unaccompanied. Since last seen, has gained about 20 pounds.  He denies chest pain, does seem hoarse, has had some shortness of breath.  HISTORY 05/25/2019 Dr. Jannifer Franklin: Chris Wood is a 64 year old right-handed white male with a history of obesity, sleep apnea, diabetes, and diabetic peripheral neuropathy.  The patient is not on CPAP, he will wake up frequently at night.  He sometimes will have trouble getting to sleep, he may take Advil PM but this causes bad dreams.  The patient had a tremor on the right upper extremity when last seen, this has not been a problem since that time.  He is on full dose Cymbalta, Keppra was added to the regimen with good improvement.  He is not having a lot of discomfort from the neuropathy at this point.  He denies any significant balance issues or falls.   REVIEW OF SYSTEMS: Out of a complete 14 system review of symptoms, the patient complains only of the following symptoms, and all other reviewed systems are negative.  Numbness  ALLERGIES: Not on File  HOME MEDICATIONS: Outpatient Medications Prior to Visit  Medication Sig Dispense Refill  . allopurinol (ZYLOPRIM) 300 MG tablet Take 300 mg by mouth daily.    Marland Kitchen amiodarone (PACERONE) 200 MG tablet Take 200 mg by mouth daily. 1/2 tab daily    . aspirin 81 MG tablet Take 81 mg by  mouth daily.    . Cholecalciferol (VITAMIN D3 PO) Take by mouth.    . furosemide (LASIX) 40 MG tablet Take 40 mg by mouth.    . gabapentin (NEURONTIN) 600 MG tablet 2 tablets three times a day 540 tablet 5  . metFORMIN (GLUCOPHAGE-XR) 500 MG 24 hr tablet Take 500 mg by mouth 2 (two) times daily.    . Multiple Vitamin (MULTIVITAMIN) capsule Take 1 capsule by mouth daily.    . potassium chloride (K-DUR) 10 MEQ tablet Take 10 mEq by mouth daily.    . rivaroxaban (XARELTO) 20 MG TABS tablet Take 20 mg by mouth daily with supper.    . rosuvastatin (CRESTOR) 20 MG tablet Take 20 mg by mouth daily.    . traMADol (ULTRAM) 50 MG tablet Take 50 mg by mouth every 6 (six) hours as needed.    . valACYclovir (VALTREX) 500 MG tablet Take 500 mg by mouth daily.    . DULoxetine (CYMBALTA) 60 MG capsule TAKE 1 CAPSULE TWICE A DAY 180 capsule 3  . levETIRAcetam (KEPPRA) 250 MG tablet Take 1 tablet (250 mg total) by mouth 2 (two) times daily. 180 tablet 1   No facility-administered medications prior to visit.    PAST MEDICAL HISTORY: Past Medical History:  Diagnosis Date  . Actinic keratosis   . Atrial fibrillation ([place])   . Chronic low back pain   . Chronic renal  insufficiency, stage 3 (moderate)   . Diabetes ([place])   . Diabetes ([place])   . Diabetic peripheral neuropathy ([place])   . Gait disorder   . Gout   . Hyperlipidemia   . Hypertension   . Hypothyroidism   . Neuropathy   . Obesity   . Peripheral edema   . Personal history of diabetic foot ulcer   . Sleep apnea   . Thrombocytopenia ([place])     PAST SURGICAL HISTORY: Past Surgical History:  Procedure Laterality Date  . CHOLECYSTECTOMY      FAMILY HISTORY: Family History  Problem Relation Age of Onset  . Healthy Mother   . Cancer Father        esophageal   . Cancer Sister   . Stroke Sister     SOCIAL HISTORY: Social History   Socioeconomic History  . Marital status: Married    Spouse name: Not on file  . Number of children: Not  on file  . Years of education: Not on file  . Highest education level: Not on file  Occupational History  . Not on file  Tobacco Use  . Smoking status: Former Smoker    Types: Cigarettes    Quit date: 09/03/2015    Years since quitting: 4.3  . Smokeless tobacco: Never Used  Substance and Sexual Activity  . Alcohol use: Yes    Comment: Every 5 or 6 moth   . Drug use: Never  . Sexual activity: Not on file  Other Topics Concern  . Not on file  Social History Narrative  . Not on file   Social Determinants of Health   Financial Resource Strain:   . Difficulty of Paying Living Expenses:   Food Insecurity:   . Worried About Charity fundraiser in the Last Year:   . Arboriculturist in the Last Year:   Transportation Needs:   . Film/video editor (Medical):   Marland Kitchen Lack of Transportation (Non-Medical):   Physical Activity:   . Days of Exercise per Week:   . Minutes of Exercise per Session:   Stress:   . Feeling of Stress :   Social Connections:   . Frequency of Communication with Friends and Family:   . Frequency of Social Gatherings with Friends and Family:   . Attends Religious Services:   . Active Member of Clubs or Organizations:   . Attends Archivist Meetings:   Marland Kitchen Marital Status:   Intimate Partner Violence:   . Fear of Current or Ex-Partner:   . Emotionally Abused:   Marland Kitchen Physically Abused:   . Sexually Abused:    PHYSICAL EXAM  Vitals:   01/20/20 1329 01/20/20 1417  BP: (!) 181/94 140/90  Pulse: (!) 119 (!) 105  SpO2: 94% 95%  Weight: (!) 311 lb (141.1 kg)   Height: 5\' 9"  (1.753 m)    Body mass index is 45.93 kg/m.  8 generalized: Well developed, in no acute distress   Neurological examination  Mentation: Alert oriented to time, place, history taking. Follows all commands speech and language fluent, voice is hoarse, appears somewhat SOB Cranial nerve II-XII: Pupils are equal, round, and dilated. Extraocular movements were full, visual field were  full on confrontational test. Facial sensation and strength were normal.  Head turning and shoulder shrug  were normal and symmetric. Motor: The motor testing reveals 5 over 5 strength of all 4 extremities. Good symmetric motor tone is noted throughout.  Sensory: Stocking pattern sensory deficit  to mid shin, 3+ edema below the knees, sore to right medial lower leg covered Coordination: Cerebellar testing reveals good finger-nose-finger and heel-to-shin bilaterally.  Gait and station: Wide-based, cautious, no assistive device Reflexes: Deep tendon reflexes are symmetric somewhat depressed throughout  DIAGNOSTIC DATA (LABS, IMAGING, TESTING) - I reviewed patient records, labs, notes, testing and imaging myself where available.  No results found for: WBC, HGB, HCT, MCV, PLT    Component Value Date/Time   PROT 7.1 01/11/2019 1547   No results found for: CHOL, HDL, LDLCALC, LDLDIRECT, TRIG, CHOLHDL No results found for: HGBA1C Lab Results  Component Value Date   VITAMINB12 498 01/11/2019   No results found for: TSH    ASSESSMENT AND PLAN 63 y.o. year old male  has a past medical history of Actinic keratosis, Atrial fibrillation (HCC), Chronic low back pain, Chronic renal insufficiency, stage 3 (moderate), Diabetes ([place]), Diabetes ([place]), Diabetic peripheral neuropathy ([place]), Gait disorder, Gout, Hyperlipidemia, Hypertension, Hypothyroidism, Neuropathy, Obesity, Peripheral edema, Personal history of diabetic foot ulcer, Sleep apnea, and Thrombocytopenia ([place]). here with:  1.  Diabetic peripheral neuropathy 2.  Sleep apnea, now on CPAP  I will increase his Keppra 250 mg in the morning, 500 mg at bedtime, due to increased neuropathy pain at night.  He will remain on Cymbalta and gabapentin.  Today, the patient initially had a high blood pressure, heart rate 118, history of A. Fib, concern for AFIB RVR, NSR was seen with EKG.  Patient is seeing his primary doctor today, he has gained about 20  pounds since last seen, appears somewhat short of breath today. I am glad he is seeing his PCP today to get a thorough evaluation.  He will follow-up here in 6 months or sooner if needed.  I spent 30 minutes of face-to-face and non-face-to-face time with patient.  This included previsit chart review, lab review, study review, order entry, electronic health record documentation, patient education.  Butler Denmark, AGNP-C, DNP 01/20/2020, 2:17 PM [facility] [address] [place], [place] [address] [phone]

## 2020-01-21 DIAGNOSIS — M7989 Other specified soft tissue disorders: Secondary | ICD-10-CM | POA: Diagnosis not present

## 2020-01-26 DIAGNOSIS — R82998 Other abnormal findings in urine: Secondary | ICD-10-CM | POA: Diagnosis not present

## 2020-01-26 DIAGNOSIS — N200 Calculus of kidney: Secondary | ICD-10-CM | POA: Diagnosis not present

## 2020-01-26 DIAGNOSIS — D696 Thrombocytopenia, unspecified: Secondary | ICD-10-CM | POA: Diagnosis not present

## 2020-01-26 DIAGNOSIS — R310 Gross hematuria: Secondary | ICD-10-CM | POA: Diagnosis not present

## 2020-01-26 DIAGNOSIS — Z7901 Long term (current) use of anticoagulants: Secondary | ICD-10-CM | POA: Diagnosis not present

## 2020-02-01 DIAGNOSIS — Z4800 Encounter for change or removal of nonsurgical wound dressing: Secondary | ICD-10-CM | POA: Diagnosis not present

## 2020-02-14 DIAGNOSIS — I452 Bifascicular block: Secondary | ICD-10-CM | POA: Diagnosis not present

## 2020-02-17 DIAGNOSIS — N200 Calculus of kidney: Secondary | ICD-10-CM | POA: Diagnosis not present

## 2020-02-17 DIAGNOSIS — R310 Gross hematuria: Secondary | ICD-10-CM | POA: Diagnosis not present

## 2020-03-30 DIAGNOSIS — Z7901 Long term (current) use of anticoagulants: Secondary | ICD-10-CM | POA: Diagnosis not present

## 2020-03-30 DIAGNOSIS — E785 Hyperlipidemia, unspecified: Secondary | ICD-10-CM | POA: Diagnosis not present

## 2020-03-30 DIAGNOSIS — G4733 Obstructive sleep apnea (adult) (pediatric): Secondary | ICD-10-CM | POA: Diagnosis not present

## 2020-03-30 DIAGNOSIS — I480 Paroxysmal atrial fibrillation: Secondary | ICD-10-CM | POA: Diagnosis not present

## 2020-04-21 DIAGNOSIS — I890 Lymphedema, not elsewhere classified: Secondary | ICD-10-CM | POA: Diagnosis not present

## 2020-04-21 DIAGNOSIS — I872 Venous insufficiency (chronic) (peripheral): Secondary | ICD-10-CM | POA: Diagnosis not present

## 2020-04-21 DIAGNOSIS — E1142 Type 2 diabetes mellitus with diabetic polyneuropathy: Secondary | ICD-10-CM | POA: Diagnosis not present

## 2020-04-21 DIAGNOSIS — G4733 Obstructive sleep apnea (adult) (pediatric): Secondary | ICD-10-CM | POA: Diagnosis not present

## 2020-04-21 DIAGNOSIS — I480 Paroxysmal atrial fibrillation: Secondary | ICD-10-CM | POA: Diagnosis not present

## 2020-04-24 DIAGNOSIS — H2512 Age-related nuclear cataract, left eye: Secondary | ICD-10-CM | POA: Diagnosis not present

## 2020-04-24 DIAGNOSIS — H25012 Cortical age-related cataract, left eye: Secondary | ICD-10-CM | POA: Diagnosis not present

## 2020-04-25 DIAGNOSIS — H25011 Cortical age-related cataract, right eye: Secondary | ICD-10-CM | POA: Diagnosis not present

## 2020-04-25 DIAGNOSIS — H25041 Posterior subcapsular polar age-related cataract, right eye: Secondary | ICD-10-CM | POA: Diagnosis not present

## 2020-04-25 DIAGNOSIS — H2511 Age-related nuclear cataract, right eye: Secondary | ICD-10-CM | POA: Diagnosis not present

## 2020-05-15 DIAGNOSIS — Z20828 Contact with and (suspected) exposure to other viral communicable diseases: Secondary | ICD-10-CM | POA: Diagnosis not present

## 2020-06-19 DIAGNOSIS — Z23 Encounter for immunization: Secondary | ICD-10-CM | POA: Diagnosis not present

## 2020-07-05 DIAGNOSIS — E1142 Type 2 diabetes mellitus with diabetic polyneuropathy: Secondary | ICD-10-CM | POA: Diagnosis not present

## 2020-07-05 DIAGNOSIS — L97522 Non-pressure chronic ulcer of other part of left foot with fat layer exposed: Secondary | ICD-10-CM | POA: Diagnosis not present

## 2020-07-25 DIAGNOSIS — J9811 Atelectasis: Secondary | ICD-10-CM | POA: Diagnosis not present

## 2020-07-25 DIAGNOSIS — I4891 Unspecified atrial fibrillation: Secondary | ICD-10-CM | POA: Diagnosis not present

## 2020-07-25 DIAGNOSIS — I5030 Unspecified diastolic (congestive) heart failure: Secondary | ICD-10-CM | POA: Diagnosis not present

## 2020-07-25 DIAGNOSIS — I482 Chronic atrial fibrillation, unspecified: Secondary | ICD-10-CM | POA: Diagnosis not present

## 2020-07-25 DIAGNOSIS — G8929 Other chronic pain: Secondary | ICD-10-CM | POA: Diagnosis not present

## 2020-07-25 DIAGNOSIS — E785 Hyperlipidemia, unspecified: Secondary | ICD-10-CM | POA: Diagnosis not present

## 2020-07-25 DIAGNOSIS — E02 Subclinical iodine-deficiency hypothyroidism: Secondary | ICD-10-CM | POA: Diagnosis not present

## 2020-07-25 DIAGNOSIS — I4510 Unspecified right bundle-branch block: Secondary | ICD-10-CM | POA: Diagnosis not present

## 2020-07-25 DIAGNOSIS — G4733 Obstructive sleep apnea (adult) (pediatric): Secondary | ICD-10-CM | POA: Diagnosis not present

## 2020-07-25 DIAGNOSIS — E1122 Type 2 diabetes mellitus with diabetic chronic kidney disease: Secondary | ICD-10-CM | POA: Diagnosis not present

## 2020-07-25 DIAGNOSIS — E11621 Type 2 diabetes mellitus with foot ulcer: Secondary | ICD-10-CM | POA: Diagnosis not present

## 2020-07-25 DIAGNOSIS — E038 Other specified hypothyroidism: Secondary | ICD-10-CM | POA: Diagnosis not present

## 2020-07-25 DIAGNOSIS — E1142 Type 2 diabetes mellitus with diabetic polyneuropathy: Secondary | ICD-10-CM | POA: Diagnosis not present

## 2020-07-25 DIAGNOSIS — M549 Dorsalgia, unspecified: Secondary | ICD-10-CM | POA: Diagnosis not present

## 2020-07-25 DIAGNOSIS — I21A1 Myocardial infarction type 2: Secondary | ICD-10-CM | POA: Diagnosis not present

## 2020-07-25 DIAGNOSIS — I130 Hypertensive heart and chronic kidney disease with heart failure and stage 1 through stage 4 chronic kidney disease, or unspecified chronic kidney disease: Secondary | ICD-10-CM | POA: Diagnosis not present

## 2020-07-25 DIAGNOSIS — R7989 Other specified abnormal findings of blood chemistry: Secondary | ICD-10-CM | POA: Diagnosis not present

## 2020-07-25 DIAGNOSIS — I4892 Unspecified atrial flutter: Secondary | ICD-10-CM | POA: Diagnosis not present

## 2020-07-25 DIAGNOSIS — I272 Pulmonary hypertension, unspecified: Secondary | ICD-10-CM | POA: Diagnosis not present

## 2020-07-25 DIAGNOSIS — E876 Hypokalemia: Secondary | ICD-10-CM | POA: Diagnosis not present

## 2020-07-25 DIAGNOSIS — R946 Abnormal results of thyroid function studies: Secondary | ICD-10-CM | POA: Diagnosis not present

## 2020-07-25 DIAGNOSIS — R42 Dizziness and giddiness: Secondary | ICD-10-CM | POA: Diagnosis not present

## 2020-07-25 DIAGNOSIS — L97529 Non-pressure chronic ulcer of other part of left foot with unspecified severity: Secondary | ICD-10-CM | POA: Diagnosis not present

## 2020-07-25 DIAGNOSIS — M109 Gout, unspecified: Secondary | ICD-10-CM | POA: Diagnosis not present

## 2020-07-25 DIAGNOSIS — D696 Thrombocytopenia, unspecified: Secondary | ICD-10-CM | POA: Diagnosis not present

## 2020-07-25 DIAGNOSIS — R9431 Abnormal electrocardiogram [ECG] [EKG]: Secondary | ICD-10-CM | POA: Diagnosis not present

## 2020-07-25 DIAGNOSIS — I872 Venous insufficiency (chronic) (peripheral): Secondary | ICD-10-CM | POA: Diagnosis not present

## 2020-07-25 DIAGNOSIS — I214 Non-ST elevation (NSTEMI) myocardial infarction: Secondary | ICD-10-CM | POA: Diagnosis not present

## 2020-07-25 DIAGNOSIS — N400 Enlarged prostate without lower urinary tract symptoms: Secondary | ICD-10-CM | POA: Diagnosis not present

## 2020-07-25 DIAGNOSIS — N183 Chronic kidney disease, stage 3 unspecified: Secondary | ICD-10-CM | POA: Diagnosis not present

## 2020-07-26 ENCOUNTER — Ambulatory Visit: Payer: Medicare Other | Admitting: Neurology

## 2020-07-26 DIAGNOSIS — I10 Essential (primary) hypertension: Secondary | ICD-10-CM | POA: Diagnosis not present

## 2020-07-26 DIAGNOSIS — R000 Tachycardia, unspecified: Secondary | ICD-10-CM | POA: Diagnosis not present

## 2020-07-26 DIAGNOSIS — I480 Paroxysmal atrial fibrillation: Secondary | ICD-10-CM | POA: Diagnosis not present

## 2020-07-26 DIAGNOSIS — Z9989 Dependence on other enabling machines and devices: Secondary | ICD-10-CM | POA: Diagnosis not present

## 2020-07-26 DIAGNOSIS — I454 Nonspecific intraventricular block: Secondary | ICD-10-CM | POA: Diagnosis not present

## 2020-07-26 DIAGNOSIS — R946 Abnormal results of thyroid function studies: Secondary | ICD-10-CM | POA: Diagnosis not present

## 2020-07-26 DIAGNOSIS — R7989 Other specified abnormal findings of blood chemistry: Secondary | ICD-10-CM | POA: Diagnosis not present

## 2020-07-26 DIAGNOSIS — E119 Type 2 diabetes mellitus without complications: Secondary | ICD-10-CM | POA: Diagnosis not present

## 2020-07-26 DIAGNOSIS — I4891 Unspecified atrial fibrillation: Secondary | ICD-10-CM | POA: Diagnosis not present

## 2020-07-26 DIAGNOSIS — I4510 Unspecified right bundle-branch block: Secondary | ICD-10-CM | POA: Diagnosis not present

## 2020-07-26 DIAGNOSIS — G4733 Obstructive sleep apnea (adult) (pediatric): Secondary | ICD-10-CM | POA: Diagnosis not present

## 2020-08-02 DIAGNOSIS — L97522 Non-pressure chronic ulcer of other part of left foot with fat layer exposed: Secondary | ICD-10-CM | POA: Diagnosis not present

## 2020-08-02 DIAGNOSIS — E1142 Type 2 diabetes mellitus with diabetic polyneuropathy: Secondary | ICD-10-CM | POA: Diagnosis not present

## 2020-08-04 DIAGNOSIS — E1142 Type 2 diabetes mellitus with diabetic polyneuropathy: Secondary | ICD-10-CM | POA: Diagnosis not present

## 2020-08-04 DIAGNOSIS — I872 Venous insufficiency (chronic) (peripheral): Secondary | ICD-10-CM | POA: Diagnosis not present

## 2020-08-04 DIAGNOSIS — N1831 Chronic kidney disease, stage 3a: Secondary | ICD-10-CM | POA: Diagnosis not present

## 2020-08-04 DIAGNOSIS — Z7901 Long term (current) use of anticoagulants: Secondary | ICD-10-CM | POA: Diagnosis not present

## 2020-08-04 DIAGNOSIS — I480 Paroxysmal atrial fibrillation: Secondary | ICD-10-CM | POA: Diagnosis not present

## 2020-08-04 DIAGNOSIS — E038 Other specified hypothyroidism: Secondary | ICD-10-CM | POA: Diagnosis not present

## 2020-08-04 DIAGNOSIS — G4733 Obstructive sleep apnea (adult) (pediatric): Secondary | ICD-10-CM | POA: Diagnosis not present

## 2020-08-04 DIAGNOSIS — E782 Mixed hyperlipidemia: Secondary | ICD-10-CM | POA: Diagnosis not present

## 2020-08-04 DIAGNOSIS — I129 Hypertensive chronic kidney disease with stage 1 through stage 4 chronic kidney disease, or unspecified chronic kidney disease: Secondary | ICD-10-CM | POA: Diagnosis not present

## 2020-08-07 DIAGNOSIS — I452 Bifascicular block: Secondary | ICD-10-CM | POA: Diagnosis not present

## 2020-08-07 DIAGNOSIS — I480 Paroxysmal atrial fibrillation: Secondary | ICD-10-CM | POA: Diagnosis not present

## 2020-10-23 ENCOUNTER — Encounter: Payer: Self-pay | Admitting: Neurology

## 2020-10-23 ENCOUNTER — Ambulatory Visit (INDEPENDENT_AMBULATORY_CARE_PROVIDER_SITE_OTHER): Payer: Medicare Other | Admitting: Neurology

## 2020-10-23 VITALS — BP 125/68 | HR 75 | Ht 69.0 in | Wt 302.0 lb

## 2020-10-23 DIAGNOSIS — E1142 Type 2 diabetes mellitus with diabetic polyneuropathy: Secondary | ICD-10-CM

## 2020-10-23 MED ORDER — DULOXETINE HCL 60 MG PO CPEP: 60.0000 mg | ORAL_CAPSULE | Freq: Two times a day (BID) | ORAL | 3 refills | Status: AC

## 2020-10-23 MED ORDER — LEVETIRACETAM 250 MG PO TABS: ORAL_TABLET | ORAL | 1 refills | Status: AC

## 2020-10-23 MED ORDER — GABAPENTIN 600 MG PO TABS: ORAL_TABLET | ORAL | 5 refills | Status: AC

## 2020-10-23 NOTE — Progress Notes (Signed)
PATIENT: Chris Wood DOB: [birth year]  REASON FOR VISIT: follow up HISTORY FROM: patient  HISTORY OF PRESENT ILLNESS: Today 10/23/20 [name] is a 65 year old male with history of obesity, sleep apnea, diabetes, and peripheral neuropathy.  He is on CPAP.  He is on maximum dose Cymbalta and gabapentin for the neuropathy, along with Keppra 250/500 mg daily. At night feels like needles, walking feels like aching from the knees down. Few falls last month winter weather related.  Weight is still 302 lbs. Seeing ENT for hoarseness, may do injections in vocal cords. His wife passed away in 06-07-23 from [place]. He is living alone now. He is driving. He is thinking about selling his home moving closer to family in the [place]. With the feet, good and bad nights, some nights he does well. Indicates diabetes fairly well controlled, had a bout of AFIB last year, when he was off amitriptyline after his wife died.  Here today for evaluation unaccompanied.  HISTORY 01/20/2020 SS: Mr. Bertram 65 year old male with history of obesity, sleep apnea, diabetes, and peripheral neuropathy.  He is now on CPAP.  He is on full dose  Cymbalta and Keppra for neuropathy, has reported increased pain at night.  He lives with his wife.  He saw his eye doctor earlier today, his eyes were dilated.  He has a tremor that comes and goes, can be in both hands.  Denies any recent falls.  Since last seen, has gained about 20 pounds.  He is tolerating medications.  He presents today for evaluation unaccompanied. Since last seen, has gained about 20 pounds.  He denies chest pain, does seem hoarse, has had some shortness of breath.   REVIEW OF SYSTEMS: Out of a complete 14 system review of symptoms, the patient complains only of the following symptoms, and all other reviewed systems are negative.  paresthesia  ALLERGIES: Not on File  HOME MEDICATIONS: Outpatient Medications Prior to Visit  Medication Sig Dispense Refill  .  allopurinol (ZYLOPRIM) 300 MG tablet Take 300 mg by mouth daily.    Marland Kitchen amiodarone (PACERONE) 200 MG tablet Take 200 mg by mouth daily. 1/2 tab daily    . aspirin 81 MG tablet Take 81 mg by mouth daily.    Marland Kitchen atorvastatin (LIPITOR) 80 MG tablet Take by mouth.    . carvedilol (COREG) 12.5 MG tablet     . Cholecalciferol (VITAMIN D3 PO) Take by mouth.    . furosemide (LASIX) 40 MG tablet Take 20 mg by mouth.    . metFORMIN (GLUCOPHAGE-XR) 500 MG 24 hr tablet Take 500 mg by mouth daily.    . Multiple Vitamin (MULTIVITAMIN) capsule Take 1 capsule by mouth daily.    . potassium chloride (K-DUR) 10 MEQ tablet Take 10 mEq by mouth daily.    . rivaroxaban (XARELTO) 20 MG TABS tablet Take 20 mg by mouth daily with supper.    . rosuvastatin (CRESTOR) 20 MG tablet Take 20 mg by mouth daily.    . traMADol (ULTRAM) 50 MG tablet Take 50 mg by mouth every 6 (six) hours as needed.    . valACYclovir (VALTREX) 500 MG tablet Take 500 mg by mouth daily.    . DULoxetine (CYMBALTA) 60 MG capsule Take 1 capsule (60 mg total) by mouth 2 (two) times daily. 180 capsule 3  . gabapentin (NEURONTIN) 600 MG tablet 2 tablets three times a day 540 tablet 5  . levETIRAcetam (KEPPRA) 250 MG tablet Take 1 in the morning, take  2 at night 270 tablet 1   No facility-administered medications prior to visit.    PAST MEDICAL HISTORY: Past Medical History:  Diagnosis Date  . Actinic keratosis   . Atrial fibrillation ([place])   . Chronic low back pain   . Chronic renal insufficiency, stage 3 (moderate) (HCC)   . Diabetes ([place])   . Diabetes ([place])   . Diabetic peripheral neuropathy ([place])   . Gait disorder   . Gout   . Hyperlipidemia   . Hypertension   . Hypothyroidism   . Neuropathy   . Obesity   . Peripheral edema   . Personal history of diabetic foot ulcer   . Sleep apnea   . Thrombocytopenia ([place])     PAST SURGICAL HISTORY: Past Surgical History:  Procedure Laterality Date  . CHOLECYSTECTOMY      FAMILY  HISTORY: Family History  Problem Relation Age of Onset  . Healthy Mother   . Cancer Father        esophageal   . Cancer Sister   . Stroke Sister     SOCIAL HISTORY: Social History   Socioeconomic History  . Marital status: Married    Spouse name: Not on file  . Number of children: Not on file  . Years of education: Not on file  . Highest education level: Not on file  Occupational History  . Not on file  Tobacco Use  . Smoking status: Former Smoker    Types: Cigarettes    Quit date: 09/03/2015    Years since quitting: 5.1  . Smokeless tobacco: Never Used  Substance and Sexual Activity  . Alcohol use: Yes    Comment: Every 5 or 6 moth   . Drug use: Never  . Sexual activity: Not on file  Other Topics Concern  . Not on file  Social History Narrative  . Not on file   Social Determinants of Health   Financial Resource Strain: Not on file  Food Insecurity: Not on file  Transportation Needs: Not on file  Physical Activity: Not on file  Stress: Not on file  Social Connections: Not on file  Intimate Partner Violence: Not on file   PHYSICAL EXAM  Vitals:   10/23/20 1345  BP: 125/68  Pulse: 75  Weight: (!) 302 lb (137 kg)  Height: 5\' 9"  (1.753 m)   Body mass index is 44.6 kg/m.  Generalized: Well developed, in no acute distress   Neurological examination  Mentation: Alert oriented to time, place, history taking. Follows all commands speech and language fluent, voice is hoarse Cranial nerve II-XII: Pupils were equal round reactive to light. Extraocular movements were full, visual field were full on confrontational test. Facial sensation and strength were normal. Head turning and shoulder shrug  were normal and symmetric. Motor: The motor testing reveals 5 over 5 strength of all 4 extremities. Good symmetric motor tone is noted throughout.  Sensory: Stocking pattern sensory deficit to mid shin, wrapped with velcro brace for swelling Coordination: Cerebellar testing  reveals good finger-nose-finger and heel-to-shin bilaterally.  Gait and station: Gait is wide-based, cautious, walks with a cane Reflexes: Deep tendon reflexes are symmetric but depressed throughout  DIAGNOSTIC DATA (LABS, IMAGING, TESTING) - I reviewed patient records, labs, notes, testing and imaging myself where available.  No results found for: WBC, HGB, HCT, MCV, PLT    Component Value Date/Time   PROT 7.1 01/11/2019 1547   No results found for: CHOL, HDL, LDLCALC, LDLDIRECT, TRIG, CHOLHDL No results found  for: HGBA1C Lab Results  Component Value Date   [id number] 575 01/11/2019   No results found for: TSH  ASSESSMENT AND PLAN 65 y.o. year old male  has a past medical history of Actinic keratosis, Atrial fibrillation ([place]), Chronic low back pain, Chronic renal insufficiency, stage 3 (moderate) ([place]), Diabetes ([place]), Diabetes ([place]), Diabetic peripheral neuropathy ([place]), Gait disorder, Gout, Hyperlipidemia, Hypertension, Hypothyroidism, Neuropathy, Obesity, Peripheral edema, Personal history of diabetic foot ulcer, Sleep apnea, and Thrombocytopenia ([place]). here with:  1.  Diabetic peripheral neuropathy -Has good and bad nights with the neuropathy, overall fairly well controlled -We will continue current medications Cymbalta, gabapentin, and Keppra -Not sure he would be a candidate for the capsaicin patch procedure but will keep him name in mind as we learn more about the procedure in office -I have written for handicap sticker -Follow-up in 8 to 9 months or sooner if needed  I spent 20 minutes of face-to-face and non-face-to-face time with patient.  This included previsit chart review, lab review, study review, order entry, electronic health record documentation, patient education.  Butler Denmark, AGNP-C, DNP 10/23/2020, 2:17 PM [facility] [address] [place], [place] [address] [phone]

## 2020-10-23 NOTE — Patient Instructions (Signed)
Continue current medications Be careful not to fall See you back in 8-9 months

## 2020-10-24 NOTE — Progress Notes (Signed)
I have read the note, and I agree with the clinical assessment and plan.  Chris Wood   

## 2021-04-07 ENCOUNTER — Emergency Department (HOSPITAL_COMMUNITY)
Admission: EM | Admit: 2021-04-07 | Discharge: 2021-04-07 | Disposition: A | Discharge status: Home / Self Care | Payer: Medicare Other | Attending: Emergency Medicine | Admitting: Emergency Medicine

## 2021-04-07 ENCOUNTER — Emergency Department (HOSPITAL_COMMUNITY): Payer: Medicare Other

## 2021-04-07 DIAGNOSIS — S022XXA Fracture of nasal bones, initial encounter for closed fracture: Secondary | ICD-10-CM

## 2021-04-07 DIAGNOSIS — W19XXXA Unspecified fall, initial encounter: Secondary | ICD-10-CM

## 2021-04-07 DIAGNOSIS — T1490XA Injury, unspecified, initial encounter: Secondary | ICD-10-CM

## 2021-04-07 DIAGNOSIS — Z20822 Contact with and (suspected) exposure to covid-19: Secondary | ICD-10-CM | POA: Insufficient documentation

## 2021-04-07 DIAGNOSIS — Z79899 Other long term (current) drug therapy: Secondary | ICD-10-CM | POA: Diagnosis not present

## 2021-04-07 DIAGNOSIS — I10 Essential (primary) hypertension: Secondary | ICD-10-CM | POA: Diagnosis not present

## 2021-04-07 DIAGNOSIS — Y900 Blood alcohol level of less than 20 mg/100 ml: Secondary | ICD-10-CM | POA: Insufficient documentation

## 2021-04-07 DIAGNOSIS — I4891 Unspecified atrial fibrillation: Secondary | ICD-10-CM | POA: Insufficient documentation

## 2021-04-07 DIAGNOSIS — Z7901 Long term (current) use of anticoagulants: Secondary | ICD-10-CM | POA: Insufficient documentation

## 2021-04-07 DIAGNOSIS — E119 Type 2 diabetes mellitus without complications: Secondary | ICD-10-CM | POA: Insufficient documentation

## 2021-04-07 DIAGNOSIS — Y9357 Activity, non-running track and field events: Secondary | ICD-10-CM | POA: Insufficient documentation

## 2021-04-07 DIAGNOSIS — W01198A Fall on same level from slipping, tripping and stumbling with subsequent striking against other object, initial encounter: Secondary | ICD-10-CM | POA: Insufficient documentation

## 2021-04-07 DIAGNOSIS — E039 Hypothyroidism, unspecified: Secondary | ICD-10-CM | POA: Insufficient documentation

## 2021-04-07 DIAGNOSIS — S0992XA Unspecified injury of nose, initial encounter: Secondary | ICD-10-CM | POA: Diagnosis present

## 2021-04-07 LAB — SAMPLE TO BLOOD BANK

## 2021-04-07 LAB — URINALYSIS, ROUTINE W REFLEX MICROSCOPIC
Bilirubin Urine: NEGATIVE
Glucose, UA: NEGATIVE mg/dL
Hgb urine dipstick: NEGATIVE
Ketones, ur: NEGATIVE mg/dL
Leukocytes,Ua: NEGATIVE
Nitrite: NEGATIVE
Protein, ur: NEGATIVE mg/dL
Specific Gravity, Urine: 1.017 (ref 1.005–1.030)
pH: 5 (ref 5.0–8.0)

## 2021-04-07 LAB — CBC
HCT: 42.7 % (ref 39.0–52.0)
Hemoglobin: 14.6 g/dL (ref 13.0–17.0)
MCH: 33.6 pg (ref 26.0–34.0)
MCHC: 34.2 g/dL (ref 30.0–36.0)
MCV: 98.2 fL (ref 80.0–100.0)
Platelets: UNDETERMINED 10*3/uL (ref 150–400)
RBC: 4.35 MIL/uL (ref 4.22–5.81)
RDW: 13.5 % (ref 11.5–15.5)
WBC: 7.5 10*3/uL (ref 4.0–10.5)
nRBC: 0 % (ref 0.0–0.2)

## 2021-04-07 LAB — COMPREHENSIVE METABOLIC PANEL
ALT: 40 U/L (ref 0–44)
AST: 35 U/L (ref 15–41)
Albumin: 3.8 g/dL (ref 3.5–5.0)
Alkaline Phosphatase: 103 U/L (ref 38–126)
Anion gap: 7 (ref 5–15)
BUN: 15 mg/dL (ref 8–23)
CO2: 27 mmol/L (ref 22–32)
Calcium: 8.7 mg/dL — ABNORMAL LOW (ref 8.9–10.3)
Chloride: 102 mmol/L (ref 98–111)
Creatinine, Ser: 1.63 mg/dL — ABNORMAL HIGH (ref 0.61–1.24)
GFR, Estimated: 46 mL/min — ABNORMAL LOW (ref >60)
Glucose, Bld: 130 mg/dL — ABNORMAL HIGH (ref 70–99)
Potassium: 4.9 mmol/L (ref 3.5–5.1)
Sodium: 136 mmol/L (ref 135–145)
Total Bilirubin: 1.2 mg/dL (ref 0.3–1.2)
Total Protein: 6.2 g/dL — ABNORMAL LOW (ref 6.5–8.1)

## 2021-04-07 LAB — I-STAT CHEM 8, ED
BUN: 16 mg/dL (ref 8–23)
Calcium, Ion: 1.14 mmol/L — ABNORMAL LOW (ref 1.15–1.40)
Chloride: 102 mmol/L (ref 98–111)
Creatinine, Ser: 1.6 mg/dL — ABNORMAL HIGH (ref 0.61–1.24)
Glucose, Bld: 127 mg/dL — ABNORMAL HIGH (ref 70–99)
HCT: 40 % (ref 39.0–52.0)
Hemoglobin: 13.6 g/dL (ref 13.0–17.0)
Potassium: 4.7 mmol/L (ref 3.5–5.1)
Sodium: 141 mmol/L (ref 135–145)
TCO2: 30 mmol/L (ref 22–32)

## 2021-04-07 LAB — LACTIC ACID, PLASMA: Lactic Acid, Venous: 2.2 mmol/L (ref 0.5–1.9)

## 2021-04-07 LAB — ETHANOL: Alcohol, Ethyl (B): <10 mg/dL (ref <10)

## 2021-04-07 LAB — PROTIME-INR
INR: 1.2 (ref 0.8–1.2)
Prothrombin Time: 14.9 seconds (ref 11.4–15.2)

## 2021-04-07 LAB — RESP PANEL BY RT-PCR (FLU A&B, COVID) ARPGX2
Influenza A by PCR: NEGATIVE
Influenza B by PCR: NEGATIVE
SARS Coronavirus 2 by RT PCR: NEGATIVE

## 2021-04-07 MED ORDER — OXYMETAZOLINE HCL 0.05 % NA SOLN: 1.0000 | Freq: Two times a day (BID) | NASAL | 0 refills | Status: AC | PRN

## 2021-04-07 MED ORDER — FENTANYL CITRATE (PF) 100 MCG/2ML IJ SOLN
50.0000 ug | Freq: Once | INTRAMUSCULAR | Status: DC
  Filled 2021-04-07: qty 2

## 2021-04-07 MED ORDER — SALINE SPRAY 0.65 % NA SOLN: 1.0000 | NASAL | 0 refills | Status: AC | PRN

## 2021-04-07 NOTE — ED Notes (Signed)
E-signature pad unavailable at time of pt discharge. This RN discussed discharge materials with pt and answered all pt questions. Pt stated understanding of discharge material. ? ?

## 2021-04-07 NOTE — ED Notes (Signed)
Patient transported to CT 

## 2021-04-07 NOTE — ED Provider Notes (Signed)
[facility] EMERGENCY DEPARTMENT Provider Note   CSN: [id number] Arrival date & time: 04/07/21  1927     History No chief complaint on file.   Chris Wood is a 65 y.o. male.  HPI  65 year old male with a history of atrial fibrillation on Xarelto, hypertension, hypothyroidism, DM 2 who presents to the emergency department as a level 2 trauma after a fall down stairs.  The [place] rac[place] when he tripped and fell experiencing a mechanical fall down 1 step.  He landed onto his face sustaining abrasions to his left knee and left forehead and the bridge of his nose.  EMS was called to the scene where he was found to be alert and oriented and hemodynamically stable and he was transported to [facility] where he arrived with a level 2 trauma ABC intact, GCS 15, hemodynamically stable.  History reviewed. No pertinent past medical history.  There are no problems to display for this patient.   History reviewed. No pertinent surgical history.     History reviewed. No pertinent family history.     Home Medications Prior to Admission medications   Medication Sig Start Date End Date Taking? Authorizing Provider  oxymetazoline (AFRIN NASAL SPRAY) 0.05 % nasal spray Place 1 spray into both nostrils 2 (two) times daily as needed for up to 3 days for congestion. 04/07/21 04/10/21 Yes Regan Lemming, MD  sodium chloride (OCEAN) 0.65 % SOLN nasal spray Place 1 spray into both nostrils as needed for up to 7 days for congestion. 04/07/21 04/14/21 Yes Regan Lemming, MD    Allergies    Patient has no allergy information on record.  Review of Systems   Review of Systems  Constitutional:  Negative for chills and fever.  HENT:  Negative for ear pain and sore throat.   Eyes:  Negative for pain and visual disturbance.  Respiratory:  Negative for cough and shortness of breath.   Cardiovascular:  Negative for chest pain and palpitations.  Gastrointestinal:   Negative for abdominal pain and vomiting.  Genitourinary:  Negative for dysuria and hematuria.  Musculoskeletal:  Negative for arthralgias and back pain.  Skin:  Positive for wound. Negative for color change and rash.  Neurological:  Negative for seizures and syncope.  All other systems reviewed and are negative.  Physical Exam Updated Vital Signs BP 137/66 (BP Location: Left Arm)   Pulse 72   Temp 97.7 F (36.5 C) (Oral)   Resp 20   Ht '5\' 9"'$  (1.753 m)   Wt 136.1 kg   SpO2 95%   BMI 44.30 kg/m   Physical Exam Vitals and nursing note reviewed.  Constitutional:      Appearance: He is well-developed.     Comments: GCS 15, ABC intact  HENT:     Head: Normocephalic.     Comments: Abrasion to the left forehead, hemostatic, abrasion to the bridge of the nose Eyes:     Conjunctiva/sclera: Conjunctivae normal.  Neck:     Comments: No midline tenderness to palpation of the cervical spine. ROM intact. Cardiovascular:     Rate and Rhythm: Normal rate and regular rhythm.     Heart sounds: No murmur heard. Pulmonary:     Effort: Pulmonary effort is normal. No respiratory distress.     Breath sounds: Normal breath sounds.  Chest:     Comments: Chest wall stable and non-tender to AP and lateral compression. Clavicles stable and non-tender to AP compression Abdominal:  Palpations: Abdomen is soft.     Tenderness: There is no abdominal tenderness.     Comments: Pelvis stable to lateral compression.  Musculoskeletal:     Cervical back: Neck supple.     Comments: No midline tenderness to palpation of the thoracic or lumbar spine. Extremities intact with intact ROM.  Abrasion to the left knee  Skin:    General: Skin is warm and dry.  Neurological:     Mental Status: He is alert.     Comments: CN II-XII grossly intact. Moving all four extremities spontaneously and sensation grossly intact.    ED Results / Procedures / Treatments   Labs (all labs ordered are listed, but only  abnormal results are displayed) Labs Reviewed  COMPREHENSIVE METABOLIC PANEL - Abnormal; Notable for the following components:      Result Value   Glucose, Bld 130 (*)    Creatinine, Ser 1.63 (*)    Calcium 8.7 (*)    Total Protein 6.2 (*)    GFR, Estimated 46 (*)    All other components within normal limits  URINALYSIS, ROUTINE W REFLEX MICROSCOPIC - Abnormal; Notable for the following components:   APPearance HAZY (*)    All other components within normal limits  LACTIC ACID, PLASMA - Abnormal; Notable for the following components:   Lactic Acid, Venous 2.2 (*)    All other components within normal limits  I-STAT CHEM 8, ED - Abnormal; Notable for the following components:   Creatinine, Ser 1.60 (*)    Glucose, Bld 127 (*)    Calcium, Ion 1.14 (*)    All other components within normal limits  RESP PANEL BY RT-PCR (FLU A&B, COVID) ARPGX2  CBC  ETHANOL  PROTIME-INR  SAMPLE TO BLOOD BANK    EKG None  Radiology CT HEAD WO CONTRAST  Result Date: 04/07/2021 CLINICAL DATA:  Tripped a race track with head strike, on Xarelto, abrasions to forehead and nose EXAM: CT HEAD WITHOUT CONTRAST CT MAXILLOFACIAL WITHOUT CONTRAST CT CERVICAL SPINE WITHOUT CONTRAST TECHNIQUE: Multidetector CT imaging of the head, cervical spine, and maxillofacial structures were performed using the standard protocol without intravenous contrast. Multiplanar CT image reconstructions of the cervical spine and maxillofacial structures were also generated. COMPARISON:  None. FINDINGS: CT HEAD FINDINGS Brain: No evidence of acute infarction, hemorrhage, hydrocephalus, extra-axial collection, visible mass lesion or mass effect. Vascular: Atherosclerotic calcification of the carotid siphons. No hyperdense vessel. Skull: Right frontal scalp swelling and crescentic scalp hematoma measuring up to 5 mm in maximal thickness extending into the maxillofacial tissues further detailed below. No subjacent calvarial fracture or acute  osseous abnormality of the calvaria. Other: None. CT MAXILLOFACIAL FINDINGS Osseous: No fracture of the bony orbits. Nasal bones appear intact with stable appearance from comparison prior imaging. Questionable fracture of the nasal spines. No other mid face fractures are seen. The pterygoid plates are intact. No visible or suspected temporal bone fractures. Patient is largely edentulous excepting the central mandibular dentition. 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 Dentures in place. Temporomandibular joints are otherwise normally aligned. Mild temporomandibular joint osteoarthrosis bilaterally. The mandible is intact. Orbits: Soft tissue swelling of the frontal scalp extending into the supraorbital and periorbital soft tissues. No retro septal gas, stranding or hemorrhage. Prior left lens extraction. The globes appear otherwise normal and symmetric. Symmetric appearance of the extraocular musculature and optic nerve sheath complexes. Normal caliber of the superior ophthalmic veins. Sinuses: Paranasal sinuses and mastoid air cells are predominantly clear. Soft tissues: Soft tissue  swelling of the frontal scalp extending into the supraorbital soft tissues and glabella. Question some mild thickening towards the philtrum. CT CERVICAL SPINE FINDINGS Alignment: Preservation of normal cervical lordosis. No abnormally widened, jumped or perched facets. Craniocervical atlantoaxial alignment is grossly maintained accounting for arthrosis. Note is made of fused flowing anterior osteophytosis involving the C7 and upper thoracic levels. Skull base and vertebrae: Extensive photon starvation is noted below the level C5 which may limit detection of subtle osseous injuries. Flowing anterior osteophytosis noted from C7 through the imaged cervical spine levels compatible with diffuse idiopathic skeletal hyperostosis. No acute skull base fracture. No discernible vertebral body fracture or height loss. The osseous structures  appear diffusely demineralized which may limit detection of small or nondisplaced fractures. No worrisome osseous lesions. Soft tissues and spinal canal: No pre or paravertebral fluid or swelling. No visible canal hematoma. Airways patent. No worrisome cervical adenopathy. No acute traumatic abnormality of the soft tissues. Disc levels: Multilevel intervertebral disc height loss with spondylitic endplate changes. Larger disc osteophyte complexes are present C5-6, C6-7 resulting in some mild canal stenosis. Additional multilevel cervical facet degenerative changes and uncinate spurring resulting in mild-to-moderate multilevel neural foraminal narrowing as well. Upper chest: Atelectatic changes in the lung apices. Other: Normal thyroid. IMPRESSION: 1. No acute intracranial abnormality. 2. Right frontal scalp swelling and crescentic scalp hematoma measuring up to 5 mm in thickness. No calvarial fracture. 3. Swelling extending into the supraorbital and glabellar soft tissues. No orbital injury is identified. 4. Questionable fracture of the nasal spines. Minimal adjacent soft tissue swelling. Correlate for point tenderness. 5. No other suspected or discernible facial bone fractures. 6. 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 7. No acute cervical spine fracture or traumatic listhesis is seen 8. Evaluation of the cervical spine upper thoracic levels is significantly limited by photon starvation and bony demineralization. 9. Features of diffuse idiopathic skeletal hyperostosis involving the C7 and visible upper thoracic levels. If there is persisting clinical concern for cervical or upper thoracic fracture, MR imaging should be considered. Electronically Signed   By: Lovena Le M.D.   On: 04/07/2021 20:46   CT CERVICAL SPINE WO CONTRAST  Result Date: 04/07/2021 CLINICAL DATA:  Tripped a race track with head strike, on Xarelto, abrasions to forehead and nose EXAM: CT HEAD  WITHOUT CONTRAST CT MAXILLOFACIAL WITHOUT CONTRAST CT CERVICAL SPINE WITHOUT CONTRAST TECHNIQUE: Multidetector CT imaging of the head, cervical spine, and maxillofacial structures were performed using the standard protocol without intravenous contrast. Multiplanar CT image reconstructions of the cervical spine and maxillofacial structures were also generated. COMPARISON:  None. FINDINGS: CT HEAD FINDINGS Brain: No evidence of acute infarction, hemorrhage, hydrocephalus, extra-axial collection, visible mass lesion or mass effect. Vascular: Atherosclerotic calcification of the carotid siphons. No hyperdense vessel. Skull: Right frontal scalp swelling and crescentic scalp hematoma measuring up to 5 mm in maximal thickness extending into the maxillofacial tissues further detailed below. No subjacent calvarial fracture or acute osseous abnormality of the calvaria. Other: None. CT MAXILLOFACIAL FINDINGS Osseous: No fracture of the bony orbits. Nasal bones appear intact with stable appearance from comparison prior imaging. Questionable fracture of the nasal spines. No other mid face fractures are seen. The pterygoid plates are intact. No visible or suspected temporal bone fractures. Patient is largely edentulous excepting the central mandibular dentition. 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 Dentures in place. Temporomandibular joints are otherwise normally aligned. Mild temporomandibular joint osteoarthrosis bilaterally. The mandible is intact. Orbits: Soft tissue swelling  of the frontal scalp extending into the supraorbital and periorbital soft tissues. No retro septal gas, stranding or hemorrhage. Prior left lens extraction. The globes appear otherwise normal and symmetric. Symmetric appearance of the extraocular musculature and optic nerve sheath complexes. Normal caliber of the superior ophthalmic veins. Sinuses: Paranasal sinuses and mastoid air cells are predominantly clear. Soft tissues: Soft tissue  swelling of the frontal scalp extending into the supraorbital soft tissues and glabella. Question some mild thickening towards the philtrum. CT CERVICAL SPINE FINDINGS Alignment: Preservation of normal cervical lordosis. No abnormally widened, jumped or perched facets. Craniocervical atlantoaxial alignment is grossly maintained accounting for arthrosis. Note is made of fused flowing anterior osteophytosis involving the C7 and upper thoracic levels. Skull base and vertebrae: Extensive photon starvation is noted below the level C5 which may limit detection of subtle osseous injuries. Flowing anterior osteophytosis noted from C7 through the imaged cervical spine levels compatible with diffuse idiopathic skeletal hyperostosis. No acute skull base fracture. No discernible vertebral body fracture or height loss. The osseous structures appear diffusely demineralized which may limit detection of small or nondisplaced fractures. No worrisome osseous lesions. Soft tissues and spinal canal: No pre or paravertebral fluid or swelling. No visible canal hematoma. Airways patent. No worrisome cervical adenopathy. No acute traumatic abnormality of the soft tissues. Disc levels: Multilevel intervertebral disc height loss with spondylitic endplate changes. Larger disc osteophyte complexes are present C5-6, C6-7 resulting in some mild canal stenosis. Additional multilevel cervical facet degenerative changes and uncinate spurring resulting in mild-to-moderate multilevel neural foraminal narrowing as well. Upper chest: Atelectatic changes in the lung apices. Other: Normal thyroid. IMPRESSION: 1. No acute intracranial abnormality. 2. Right frontal scalp swelling and crescentic scalp hematoma measuring up to 5 mm in thickness. No calvarial fracture. 3. Swelling extending into the supraorbital and glabellar soft tissues. No orbital injury is identified. 4. Questionable fracture of the nasal spines. Minimal adjacent soft tissue swelling.  Correlate for point tenderness. 5. No other suspected or discernible facial bone fractures. 6. 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 7. No acute cervical spine fracture or traumatic listhesis is seen 8. Evaluation of the cervical spine upper thoracic levels is significantly limited by photon starvation and bony demineralization. 9. Features of diffuse idiopathic skeletal hyperostosis involving the C7 and visible upper thoracic levels. If there is persisting clinical concern for cervical or upper thoracic fracture, MR imaging should be considered. Electronically Signed   By: Lovena Le M.D.   On: 04/07/2021 20:46   DG Pelvis Portable  Result Date: 04/07/2021 CLINICAL DATA:  Low O2 trauma, fall EXAM: PORTABLE PELVIS 1-2 VIEWS COMPARISON:  None. FINDINGS: Symmetric degenerative changes in the hips with joint space narrowing and spurring. No acute bony abnormality. Specifically, no fracture, subluxation, or dislocation. IMPRESSION: No acute bony abnormality. Electronically Signed   By: Rolm Baptise M.D.   On: 04/07/2021 20:00   DG Chest Port 1 View  Result Date: 04/07/2021 CLINICAL DATA:  Level 2 trauma.  Fall EXAM: PORTABLE CHEST 1 VIEW COMPARISON:  None. FINDINGS: Heart and mediastinal contours are within normal limits. No focal opacities or effusions. No acute bony abnormality. No pneumothorax. IMPRESSION: No active disease. Electronically Signed   By: Rolm Baptise M.D.   On: 04/07/2021 20:00   DG Knee Left Port  Result Date: 04/07/2021 CLINICAL DATA:  Level 2 trauma, fall EXAM: PORTABLE LEFT KNEE - 1-2 VIEW COMPARISON:  None. FINDINGS: Advanced tricompartment degenerative changes with joint space narrowing and spurring.  No joint effusion. Anterior soft tissue swelling. No acute bony abnormality. Specifically, no fracture, subluxation, or dislocation. IMPRESSION: Advanced tricompartment degenerative changes. Anterior soft tissue swelling. No acute bony  abnormality. Electronically Signed   By: Rolm Baptise M.D.   On: 04/07/2021 20:01   CT Maxillofacial Wo Contrast  Result Date: 04/07/2021 CLINICAL DATA:  Tripped a race track with head strike, on Xarelto, abrasions to forehead and nose EXAM: CT HEAD WITHOUT CONTRAST CT MAXILLOFACIAL WITHOUT CONTRAST CT CERVICAL SPINE WITHOUT CONTRAST TECHNIQUE: Multidetector CT imaging of the head, cervical spine, and maxillofacial structures were performed using the standard protocol without intravenous contrast. Multiplanar CT image reconstructions of the cervical spine and maxillofacial structures were also generated. COMPARISON:  None. FINDINGS: CT HEAD FINDINGS Brain: No evidence of acute infarction, hemorrhage, hydrocephalus, extra-axial collection, visible mass lesion or mass effect. Vascular: Atherosclerotic calcification of the carotid siphons. No hyperdense vessel. Skull: Right frontal scalp swelling and crescentic scalp hematoma measuring up to 5 mm in maximal thickness extending into the maxillofacial tissues further detailed below. No subjacent calvarial fracture or acute osseous abnormality of the calvaria. Other: None. CT MAXILLOFACIAL FINDINGS Osseous: No fracture of the bony orbits. Nasal bones appear intact with stable appearance from comparison prior imaging. Questionable fracture of the nasal spines. No other mid face fractures are seen. The pterygoid plates are intact. No visible or suspected temporal bone fractures. Patient is largely edentulous excepting the central mandibular dentition. 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 Dentures in place. Temporomandibular joints are otherwise normally aligned. Mild temporomandibular joint osteoarthrosis bilaterally. The mandible is intact. Orbits: Soft tissue swelling of the frontal scalp extending into the supraorbital and periorbital soft tissues. No retro septal gas, stranding or hemorrhage. Prior left lens extraction. The globes appear otherwise normal and  symmetric. Symmetric appearance of the extraocular musculature and optic nerve sheath complexes. Normal caliber of the superior ophthalmic veins. Sinuses: Paranasal sinuses and mastoid air cells are predominantly clear. Soft tissues: Soft tissue swelling of the frontal scalp extending into the supraorbital soft tissues and glabella. Question some mild thickening towards the philtrum. CT CERVICAL SPINE FINDINGS Alignment: Preservation of normal cervical lordosis. No abnormally widened, jumped or perched facets. Craniocervical atlantoaxial alignment is grossly maintained accounting for arthrosis. Note is made of fused flowing anterior osteophytosis involving the C7 and upper thoracic levels. Skull base and vertebrae: Extensive photon starvation is noted below the level C5 which may limit detection of subtle osseous injuries. Flowing anterior osteophytosis noted from C7 through the imaged cervical spine levels compatible with diffuse idiopathic skeletal hyperostosis. No acute skull base fracture. No discernible vertebral body fracture or height loss. The osseous structures appear diffusely demineralized which may limit detection of small or nondisplaced fractures. No worrisome osseous lesions. Soft tissues and spinal canal: No pre or paravertebral fluid or swelling. No visible canal hematoma. Airways patent. No worrisome cervical adenopathy. No acute traumatic abnormality of the soft tissues. Disc levels: Multilevel intervertebral disc height loss with spondylitic endplate changes. Larger disc osteophyte complexes are present C5-6, C6-7 resulting in some mild canal stenosis. Additional multilevel cervical facet degenerative changes and uncinate spurring resulting in mild-to-moderate multilevel neural foraminal narrowing as well. Upper chest: Atelectatic changes in the lung apices. Other: Normal thyroid. IMPRESSION: 1. No acute intracranial abnormality. 2. Right frontal scalp swelling and crescentic scalp hematoma  measuring up to 5 mm in thickness. No calvarial fracture. 3. Swelling extending into the supraorbital and glabellar soft tissues. No orbital injury is identified. 4. Questionable fracture of the  nasal spines. Minimal adjacent soft tissue swelling. Correlate for point tenderness. 5. No other suspected or discernible facial bone fractures. 6. 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 7. No acute cervical spine fracture or traumatic listhesis is seen 8. Evaluation of the cervical spine upper thoracic levels is significantly limited by photon starvation and bony demineralization. 9. Features of diffuse idiopathic skeletal hyperostosis involving the C7 and visible upper thoracic levels. If there is persisting clinical concern for cervical or upper thoracic fracture, MR imaging should be considered. Electronically Signed   By: Lovena Le M.D.   On: 04/07/2021 20:46    Procedures Ultrasound ED Peripheral IV (Provider)  Date/Time: 04/07/2021 8:01 PM Performed by: Regan Lemming, MD Authorized by: Regan Lemming, MD   Procedure details:    Indications: multiple failed IV attempts and poor IV access     Skin Prep: chlorhexidine gluconate     Location:  Right AC   Angiocath:  18 G   Bedside Ultrasound Guided: Yes     Images: not archived     Patient tolerated procedure without complications: Yes     Dressing applied: Yes     Medications Ordered in ED Medications - No data to display  ED Course  I have reviewed the triage vital signs and the nursing notes.  Pertinent labs & imaging results that were available during my care of the patient were reviewed by me and considered in my medical decision making (see chart for details).    MDM Rules/Calculators/A&P                           Chris Wood is a 65 y.o. male who presents by EMS as a Level 2 Trauma patient after a ground level fall on Xarelto as per above.  Currently, he is awake, alert, and  protecting his own airway and is hemodynamically stable.  Trauma imaging revealed (full reports in EMR): Portable CXR:  No evidence of pneumothorax or tracheal deviation Portable Pelvis:  No evidence of acute hip fracture or malignment FAST:  Not performed CT scans (pan-scan): No acute intracranial abnormality, right frontal scalp swelling and crescentic scalp hematoma 5 mm in thickness with no calvarial fracture, swelling extending into the supraorbital and glabellar soft tissues, no orbital injury, questionable fracture of the nasal spines with minimal adjacent soft tissue swelling.  No other facial bone fractures.  No acute cervical spine fracture or traumatic malalignment.  Labs significant for mildly elevated serum creatinine to 1.6 with no priors for comparison.  COVID-19 and influenza PCR resulted negative, PT/INR normal, lactic acid mildly elevated 2.2, ethanol normal, CBC without leukocytosis, anemia or platelet abnormality.  The patient is alert and oriented x3, GCS 15, hemodynamically stable with abrasions present that are currently hemostatic.  His tetanus is up-to-date per his report.  No injuries intracranially identified on CT imaging.  No other injuries identified on primary or secondary survey.  He has intact range of motion of the left knee with no concern for traumatic hemarthrosis.  She was ambulatory in the emergency department without difficulty. Patient was observed in the emergency department with a normal neurologic exam.  He had some point tenderness of his nose concerning for nasal fracture.  He was advised Afrin, no nose blowing, nasal saline for the next 3 days and and advised ENT follow-up in clinic.  Final Clinical Impression(s) / ED Diagnoses Final diagnoses:  Trauma  Fall, initial encounter  Closed  fracture of nasal bone, initial encounter    Rx / DC Orders ED Discharge Orders          Ordered    oxymetazoline (AFRIN NASAL SPRAY) 0.05 % nasal spray  2 times  daily PRN        04/07/21 2135    sodium chloride (OCEAN) 0.65 % SOLN nasal spray  As needed        04/07/21 2135             Regan Lemming, MD 04/08/21 1235

## 2021-04-07 NOTE — ED Triage Notes (Signed)
Pt tripped [place] a race track and  hit his head, abrasion to knee. No loss of consciousness. Takes xarelto. C/o headache and pain to L knee with abrasion. Abrasions to forehead and nose.  EMS: 186/110 74 HR  96% CBG 132 GCS 15

## 2021-04-07 NOTE — Discharge Instructions (Addendum)
Please follow-up in clinic with an ear nose and throat specialist regarding your possible nasal bone fracture.  Recommend no nose blowing, Afrin as needed for the next 3 days for nosebleed.

## 2021-04-07 NOTE — Progress Notes (Signed)
Orthopedic Tech Progress Note Patient Details:  Siddiq Maharrey 08-05-1956 [id number]  Responded to Level 2 trauma.  Patient ID: Fumio Berard, male   DOB: 08-08-1956, 65 y.o.   MRN: [id number]  Carin Primrose 04/07/2021, 7:44 PM

## 2021-04-07 NOTE — Progress Notes (Signed)
   04/07/21 1912  Clinical Encounter Type  Visited With Patient not available  Visit Type Initial;Trauma  Referral From Nurse  Consult/Referral To Chaplain   Chaplain responded to Level 2 trauma. Pt being treated and no support person present. No current need for chaplain. Chaplain remains available.   This note was prepared by Chaplain Resident, Dante Gang, MDiv. Chaplain remains available as needed through the on-call pager: [phone].

## 2021-04-08 ENCOUNTER — Encounter (HOSPITAL_COMMUNITY): Payer: Self-pay | Admitting: Emergency Medicine

## 2021-04-09 ENCOUNTER — Encounter: Payer: Self-pay | Admitting: Neurology

## 2021-07-23 ENCOUNTER — Ambulatory Visit: Payer: Medicare Other | Admitting: Neurology

## 2021-08-01 NOTE — Progress Notes (Deleted)
No chief complaint on file.    HISTORY OF PRESENT ILLNESS:  08/01/21 ALL:  Chris Wood is a 65 y.o. male here today for follow up for neuropathy. He continues duloxetine 60mg  BID, levetiracetam 250/500mg  and gabapentin 1200mg  BID. Last A1C 5.3.   He did have a fall 04/07/2021. [place] racetr[place] and fell [place] 1 step. He had multiple abrasions of knees and face but otherwise no significant injuries.   He continues CPAP followed by [facility]. Last follow up 02/01/2021 showed no data for review due to recent eye surgery.    10/23/20 SS: Chris Wood is a 65 year old male with history of obesity, sleep apnea, diabetes, and peripheral neuropathy.  He is on CPAP.  He is on maximum dose Cymbalta and gabapentin for the neuropathy, along with Keppra 250/500 mg daily. At night feels like needles, walking feels like aching from the knees down. Few falls last month winter weather related.  Weight is still 302 lbs. Seeing ENT for hoarseness, may do injections in vocal cords. His wife passed away in 06/09/23 from [place]. He is living alone now. He is driving. He is thinking about selling his home moving closer to family in the [place]. With the feet, good and bad nights, some nights he does well. Indicates diabetes fairly well controlled, had a bout of AFIB last year, when he was off amitriptyline after his wife died.  Here today for evaluation unaccompanied.  REVIEW OF SYSTEMS: Out of a complete 14 system review of symptoms, the patient complains only of the following symptoms, and all other reviewed systems are negative.   ALLERGIES: Not on File   HOME MEDICATIONS: Outpatient Medications Prior to Visit  Medication Sig Dispense Refill   allopurinol (ZYLOPRIM) 300 MG tablet Take 300 mg by mouth daily.     amiodarone (PACERONE) 200 MG tablet Take 200 mg by mouth daily. 1/2 tab daily     aspirin 81 MG tablet Take 81 mg by mouth daily.     atorvastatin (LIPITOR) 80 MG tablet Take  by mouth.     carvedilol (COREG) 12.5 MG tablet      Cholecalciferol (VITAMIN D3 PO) Take by mouth.     DULoxetine (CYMBALTA) 60 MG capsule Take 1 capsule (60 mg total) by mouth 2 (two) times daily. 180 capsule 3   furosemide (LASIX) 40 MG tablet Take 20 mg by mouth.     gabapentin (NEURONTIN) 600 MG tablet 2 tablets three times a day 540 tablet 5   levETIRAcetam (KEPPRA) 250 MG tablet Take 1 in the morning, take 2 at night 270 tablet 1   metFORMIN (GLUCOPHAGE-XR) 500 MG 24 hr tablet Take 500 mg by mouth daily.     Multiple Vitamin (MULTIVITAMIN) capsule Take 1 capsule by mouth daily.     potassium chloride (K-DUR) 10 MEQ tablet Take 10 mEq by mouth daily.     rivaroxaban (XARELTO) 20 MG TABS tablet Take 20 mg by mouth daily with supper.     rosuvastatin (CRESTOR) 20 MG tablet Take 20 mg by mouth daily.     sodium chloride (OCEAN) 0.65 % SOLN nasal spray Place 1 spray into both nostrils as needed for up to 7 days for congestion. 30 mL 0   traMADol (ULTRAM) 50 MG tablet Take 50 mg by mouth every 6 (six) hours as needed.     valACYclovir (VALTREX) 500 MG tablet Take 500 mg by mouth daily.     No facility-administered medications prior to  visit.     PAST MEDICAL HISTORY: Past Medical History:  Diagnosis Date   Actinic keratosis    Atrial fibrillation (HCC)    Chronic low back pain    Chronic renal insufficiency, stage 3 (moderate) (HCC)    Diabetes (HCC)    Diabetes (HCC)    Diabetic peripheral neuropathy (HCC)    Gait disorder    Gout    Hyperlipidemia    Hypertension    Hypothyroidism    Neuropathy    Obesity    Peripheral edema    Personal history of diabetic foot ulcer    Sleep apnea    Thrombocytopenia (HCC)      PAST SURGICAL HISTORY: Past Surgical History:  Procedure Laterality Date   CHOLECYSTECTOMY       FAMILY HISTORY: Family History  Problem Relation Age of Onset   Healthy Mother    Cancer Father        esophageal    Cancer Sister    Stroke Sister       SOCIAL HISTORY: Social History   Socioeconomic History   Marital status: Unknown    Spouse name: Not on file   Number of children: Not on file   Years of education: Not on file   Highest education level: Not on file  Occupational History   Not on file  Tobacco Use   Smoking status: Not on file   Smokeless tobacco: Never  Substance and Sexual Activity   Alcohol use: Yes    Comment: Every 5 or 6 moth    Drug use: Never   Sexual activity: Not on file  Other Topics Concern   Not on file  Social History Narrative   ** Merged History Encounter **       Social Determinants of Health   Financial Resource Strain: Not on file  Food Insecurity: Not on file  Transportation Needs: Not on file  Physical Activity: Not on file  Stress: Not on file  Social Connections: Not on file  Intimate Partner Violence: Not on file     PHYSICAL EXAM  There were no vitals filed for this visit. There is no height or weight on file to calculate BMI.  Generalized: Well developed, in no acute distress  Cardiology: normal rate and rhythm, no murmur auscultated  Respiratory: clear to auscultation bilaterally    Neurological examination  Mentation: Alert oriented to time, place, history taking. Follows all commands speech and language fluent Cranial nerve II-XII: Pupils were equal round reactive to light. Extraocular movements were full, visual field were full on confrontational test. Facial sensation and strength were normal. Uvula tongue midline. Head turning and shoulder shrug  were normal and symmetric. Motor: The motor testing reveals 5 over 5 strength of all 4 extremities. Good symmetric motor tone is noted throughout.  Sensory: Sensory testing is intact to soft touch on all 4 extremities. No evidence of extinction is noted.  Coordination: Cerebellar testing reveals good finger-nose-finger and heel-to-shin bilaterally.  Gait and station: Gait is normal. Tandem gait is normal. Romberg is  negative. No drift is seen.  Reflexes: Deep tendon reflexes are symmetric and normal bilaterally.    DIAGNOSTIC DATA (LABS, IMAGING, TESTING) - I reviewed patient records, labs, notes, testing and imaging myself where available.  Lab Results  Component Value Date   WBC 7.5 04/07/2021   HGB 13.6 04/07/2021   HCT 40.0 04/07/2021   MCV 98.2 04/07/2021   PLT PLATELET CLUMPS NOTED ON SMEAR, UNABLE TO ESTIMATE 04/07/2021  Component Value Date/Time   NA 141 04/07/2021 2008   K 4.7 04/07/2021 2008   CL 102 04/07/2021 2008   CO2 27 04/07/2021 1955   GLUCOSE 127 (H) 04/07/2021 2008   BUN 16 04/07/2021 2008   CREATININE 1.60 (H) 04/07/2021 2008   CALCIUM 8.7 (L) 04/07/2021 1955   PROT 6.2 (L) 04/07/2021 1955   PROT 7.1 01/11/2019 1547   ALBUMIN 3.8 04/07/2021 1955   AST 35 04/07/2021 1955   ALT 40 04/07/2021 1955   ALKPHOS 103 04/07/2021 1955   BILITOT 1.2 04/07/2021 1955   GFRNONAA 46 (L) 04/07/2021 1955   No results found for: CHOL, HDL, LDLCALC, LDLDIRECT, TRIG, CHOLHDL No results found for: HGBA1C Lab Results  Component Value Date   VITAMINB12 498 01/11/2019   No results found for: TSH  No flowsheet data found.   No flowsheet data found.   ASSESSMENT AND PLAN  65 y.o. year old male  has a past medical history of Actinic keratosis, Atrial fibrillation ([place]), Chronic low back pain, Chronic renal insufficiency, stage 3 (moderate) ([place]), Diabetes ([place]), Diabetes ([place]), Diabetic peripheral neuropathy ([place]), Gait disorder, Gout, Hyperlipidemia, Hypertension, Hypothyroidism, Neuropathy, Obesity, Peripheral edema, Personal history of diabetic foot ulcer, Sleep apnea, and Thrombocytopenia ([place]). here with    No diagnosis found.   No orders of the defined types were placed in this encounter.    No orders of the defined types were placed in this encounter.     Debbora Presto, MSN, FNP-C 08/01/2021, 3:01 PM  [facility] [address] [place], [place] [address] [phone]

## 2021-08-01 NOTE — Patient Instructions (Incomplete)
Below is our plan:  We will continue duloxetine 60mg  twice daily, gabapentin 1200mg  three times daly and levetiracetam 250mg  in the morning and 500mg  at bedtime.   Please make sure you are staying well hydrated. I recommend 50-60 ounces daily. Well balanced diet and regular exercise encouraged. Consistent sleep schedule with 6-8 hours recommended.   Please continue follow up with care team as directed.   Follow up with Chris Wood in 6-12 months  You may receive a survey regarding today's visit. I encourage you to leave honest feed back as I do use this information to improve patient care. Thank you for seeing me today!

## 2021-08-02 ENCOUNTER — Encounter: Payer: Self-pay | Admitting: Family Medicine

## 2021-08-02 ENCOUNTER — Ambulatory Visit: Payer: Medicare Other | Admitting: Family Medicine

## 2021-08-02 DIAGNOSIS — E1142 Type 2 diabetes mellitus with diabetic polyneuropathy: Secondary | ICD-10-CM

## 2022-08-20 IMAGING — DX DG KNEE 1-2V PORT*L*
2 series · 2 of 2 positions shown · non-contrast
Comparison: None.

CLINICAL DATA: Level 2 trauma, fall

EXAM:
PORTABLE LEFT KNEE - 1-2 VIEW

[knee ap]
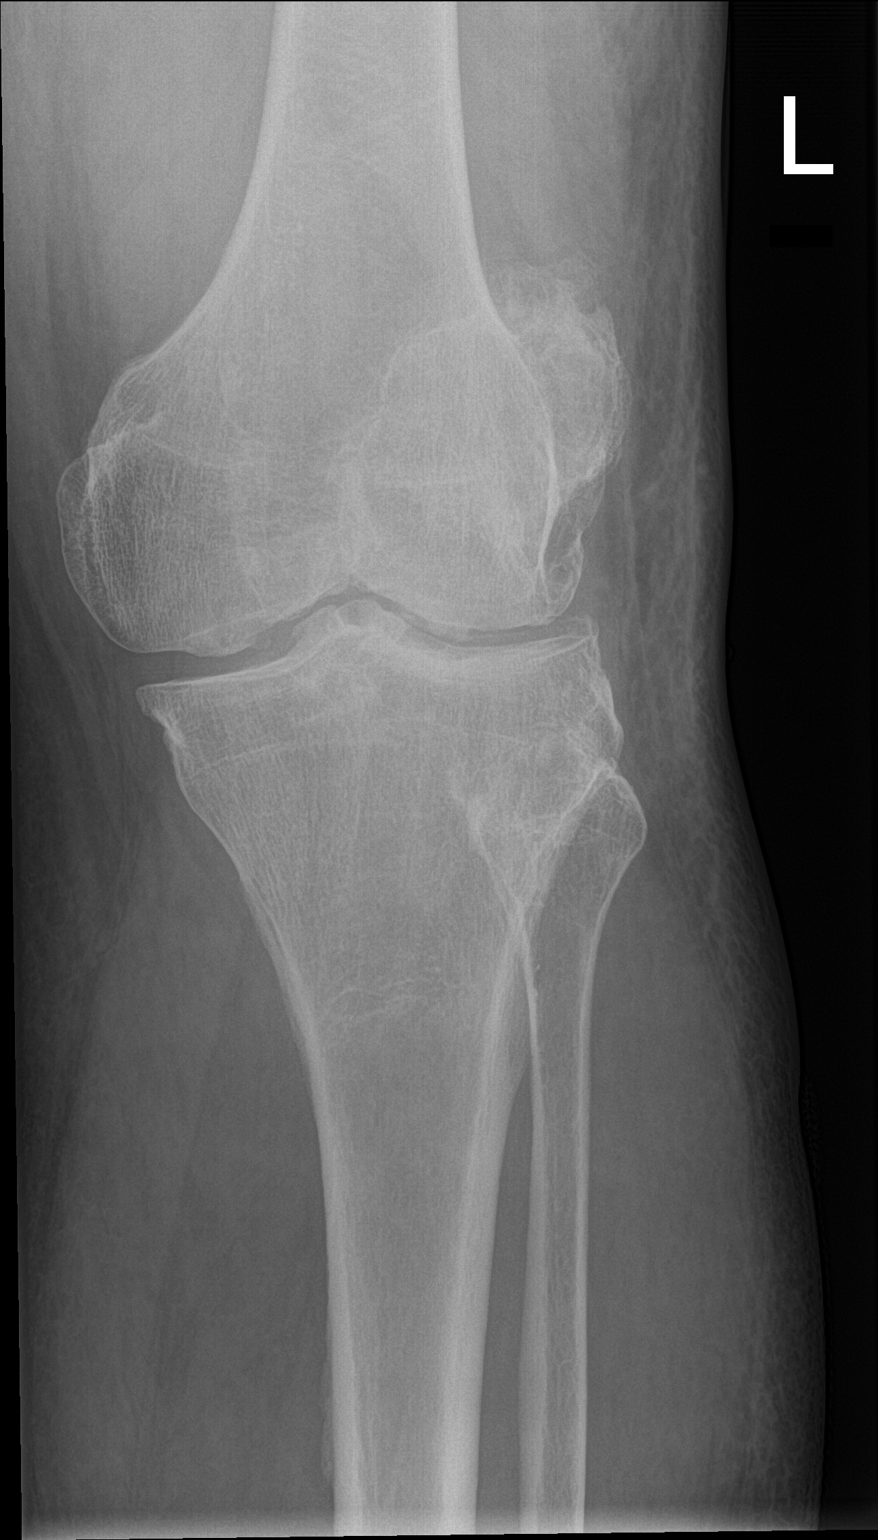

[knee lat]
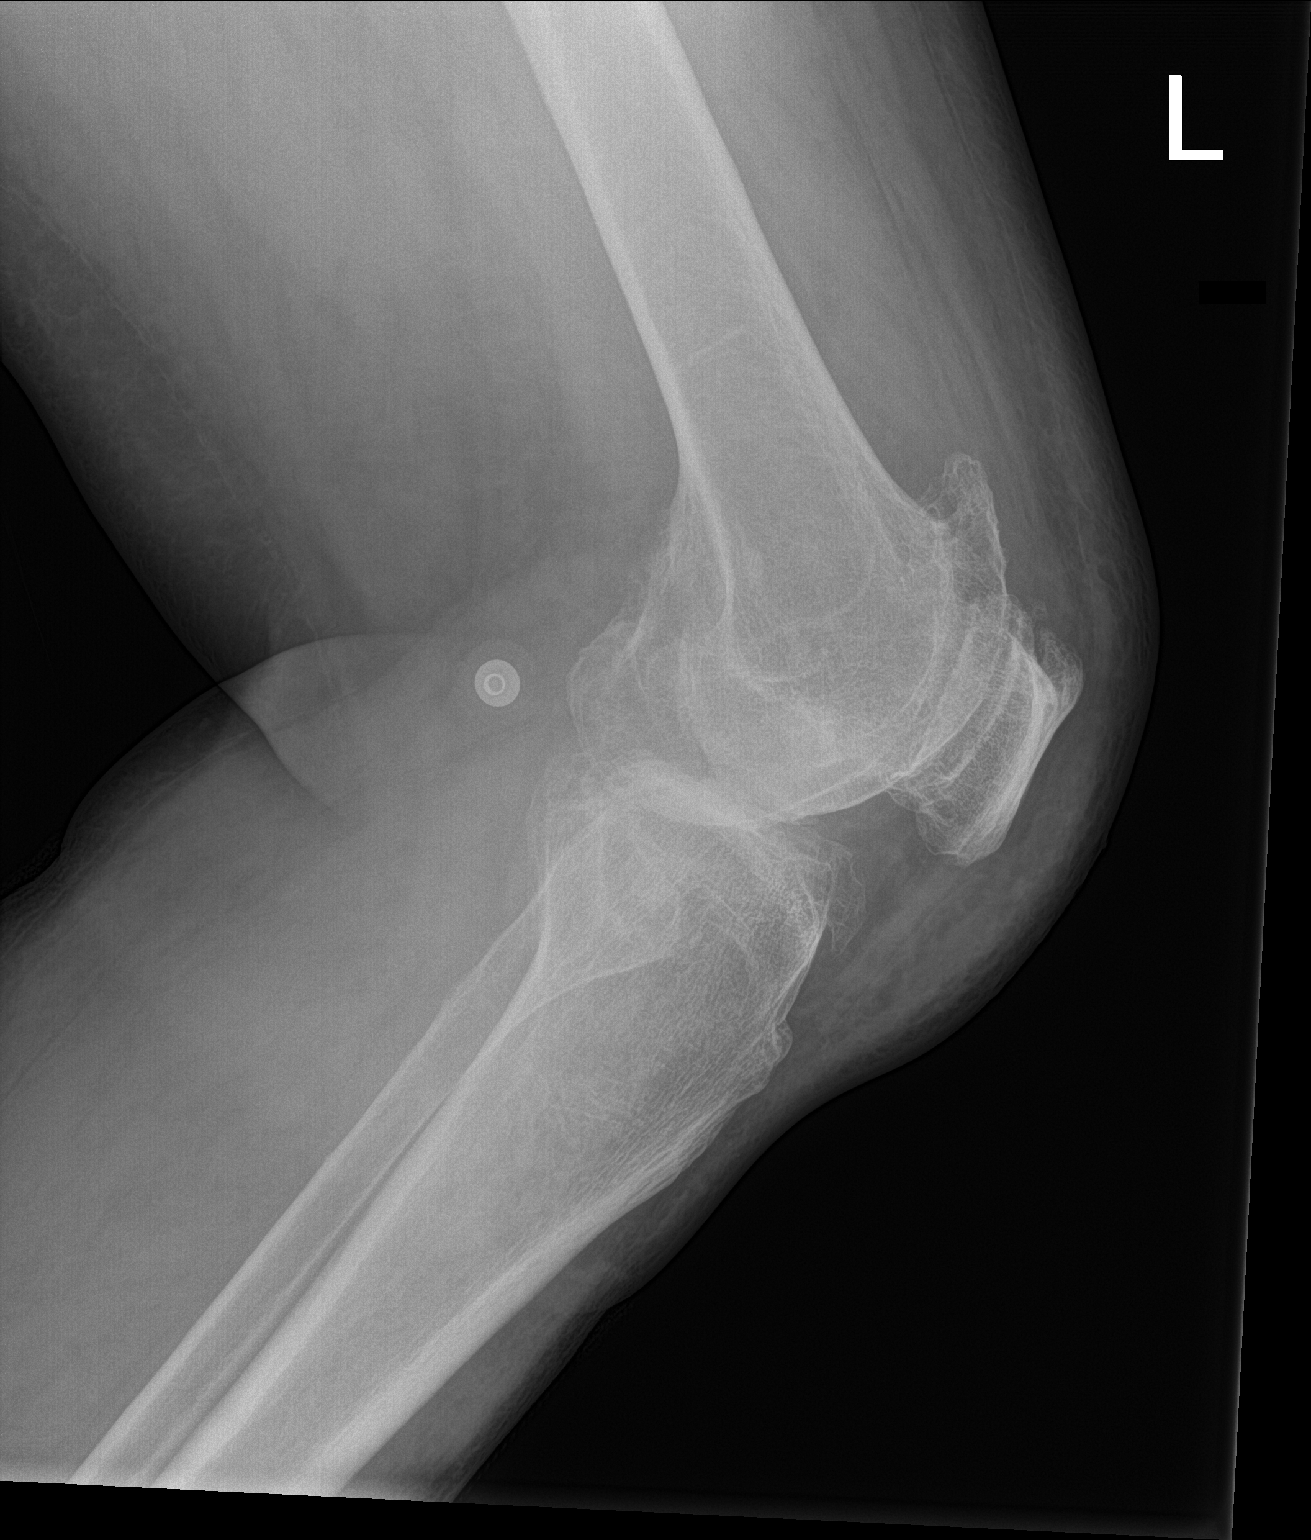

[2 of 2 positions shown; findings below may reference images not displayed]

FINDINGS: Advanced tricompartment degenerative changes with joint space
narrowing and spurring. No joint effusion. Anterior soft tissue
swelling. No acute bony abnormality. Specifically, no fracture,
subluxation, or dislocation.
IMPRESSION: Advanced tricompartment degenerative changes. Anterior soft tissue
swelling. No acute bony abnormality.
